# Patient Record
Sex: Male | Born: 1990 | Race: White | Hispanic: No | Marital: Single | Smoking: Never smoker
Health system: Southern US, Community
[De-identification: ages and names within clinical notes are randomized; demographics above are authoritative.]

## PROBLEM LIST (undated history)

## (undated) DIAGNOSIS — F411 Generalized anxiety disorder: Secondary | ICD-10-CM

## (undated) DIAGNOSIS — F419 Anxiety disorder, unspecified: Secondary | ICD-10-CM

## (undated) DIAGNOSIS — I10 Essential (primary) hypertension: Secondary | ICD-10-CM

## (undated) DIAGNOSIS — F319 Bipolar disorder, unspecified: Secondary | ICD-10-CM

## (undated) DIAGNOSIS — F4310 Post-traumatic stress disorder, unspecified: Secondary | ICD-10-CM

## (undated) DIAGNOSIS — F410 Panic disorder [episodic paroxysmal anxiety] without agoraphobia: Secondary | ICD-10-CM

## (undated) DIAGNOSIS — F429 Obsessive-compulsive disorder, unspecified: Secondary | ICD-10-CM

## (undated) DIAGNOSIS — J45909 Unspecified asthma, uncomplicated: Secondary | ICD-10-CM

## (undated) HISTORY — PX: NO PAST SURGERIES: SHX2092

---

## 2012-08-04 ENCOUNTER — Encounter (HOSPITAL_COMMUNITY): Payer: Self-pay

## 2012-08-04 ENCOUNTER — Other Ambulatory Visit: Payer: Self-pay

## 2012-08-04 ENCOUNTER — Emergency Department (HOSPITAL_COMMUNITY)
Admission: EM | Admit: 2012-08-04 | Discharge: 2012-08-04 | Disposition: A | Discharge status: Home / Self Care | Payer: Federal, State, Local not specified - PPO | Attending: Emergency Medicine | Admitting: Emergency Medicine

## 2012-08-04 DIAGNOSIS — F411 Generalized anxiety disorder: Secondary | ICD-10-CM | POA: Insufficient documentation

## 2012-08-04 DIAGNOSIS — R079 Chest pain, unspecified: Secondary | ICD-10-CM | POA: Insufficient documentation

## 2012-08-04 DIAGNOSIS — J45909 Unspecified asthma, uncomplicated: Secondary | ICD-10-CM | POA: Insufficient documentation

## 2012-08-04 DIAGNOSIS — Z79899 Other long term (current) drug therapy: Secondary | ICD-10-CM | POA: Insufficient documentation

## 2012-08-04 DIAGNOSIS — F419 Anxiety disorder, unspecified: Secondary | ICD-10-CM

## 2012-08-04 HISTORY — DX: Panic disorder (episodic paroxysmal anxiety): F41.0

## 2012-08-04 HISTORY — DX: Anxiety disorder, unspecified: F41.9

## 2012-08-04 HISTORY — DX: Unspecified asthma, uncomplicated: J45.909

## 2012-08-04 MED ORDER — LORAZEPAM 1 MG PO TABS
1.0000 mg | ORAL_TABLET | Freq: Once | ORAL | Status: AC
  Administered 2012-08-04: 1 mg via ORAL
  Filled 2012-08-04: qty 1

## 2012-08-04 MED ORDER — LORAZEPAM 1 MG PO TABS: 1.0000 mg | ORAL_TABLET | Freq: Three times a day (TID) | ORAL | Status: DC | PRN

## 2012-08-04 NOTE — ED Notes (Signed)
Pt c/o " feeling my chest hurts and I feel like I can't breathe" Charge nurse made aware and pt taken to Acute side of ED for evaluation

## 2012-08-04 NOTE — ED Provider Notes (Signed)
History     CSN: [id number]  Arrival date & time 08/04/12  1715   First MD Initiated Contact with Patient 08/04/12 1832      Chief Complaint  Patient presents with  . Anxiety  . Chest Pain    (Consider location/radiation/quality/duration/timing/severity/associated sxs/prior treatment) HPI Comments: Patient with hx of recurrent panic attacks and anxiety states that he developed a sudden onset of chest tightness, difficulty breathing, and "the jitters" after he was arguing with his mother this afternoon.  States that he recently moved here from [place] and his doctor there was trying different medications to control the panic attacks.  He denies cocaine use, street drugs or IV drug use.  He has not tried any medications or home therapies.  States he has not been evaluated by a doctor here for his anxiety.  Patient is a 22 y.o. male presenting with anxiety. The history is provided by the patient.  Anxiety This is a recurrent problem. The current episode started today. The problem occurs intermittently. The problem has been unchanged. Associated symptoms include chest pain. Pertinent negatives include no congestion, coughing, fever, headaches, joint swelling, nausea, neck pain, numbness, rash, vomiting or weakness. Associated symptoms comments: Chest tightness and "jitters". The symptoms are aggravated by stress. He has tried nothing for the symptoms. The treatment provided no relief.    Past Medical History  Diagnosis Date  . Asthma   . Anxiety   . Panic attack     History reviewed. No pertinent past surgical history.  No family history on file.  History  Substance Use Topics  . Smoking status: Never Smoker   . Smokeless tobacco: Not on file  . Alcohol Use: No      Review of Systems  Constitutional: Negative for fever, activity change and appetite change.  HENT: Negative for congestion and neck pain.   Respiratory: Positive for chest tightness. Negative for cough and  shortness of breath.   Cardiovascular: Positive for chest pain. Negative for palpitations and leg swelling.  Gastrointestinal: Negative for nausea and vomiting.  Musculoskeletal: Negative for joint swelling.  Skin: Negative for rash.  Neurological: Negative for seizures, syncope, speech difficulty, weakness, numbness and headaches.  Psychiatric/Behavioral: Negative for confusion. The patient is nervous/anxious.   All other systems reviewed and are negative.    Allergies  Review of patient's allergies indicates no known allergies.  Home Medications   Current Outpatient Rx  Name  Route  Sig  Dispense  Refill  . atenolol (TENORMIN) 50 MG tablet   Oral   Take 50 mg by mouth daily.           BP 135/82  Pulse 77  Temp(Src) 97.2 F (36.2 C) (Oral)  Resp 30  Ht 6' (1.829 m)  Wt 210 lb (95.255 kg)  BMI 28.47 kg/m2  SpO2 99%  Physical Exam  Nursing note and vitals reviewed. Constitutional: He is oriented to person, place, and time. He appears well-developed and well-nourished. No distress.  HENT:  Head: Normocephalic and atraumatic.  Mouth/Throat: Oropharynx is clear and moist.  Eyes: Conjunctivae are normal. Pupils are equal, round, and reactive to light.  Neck: Normal range of motion. Neck supple. No thyromegaly present.  Cardiovascular: Normal rate, regular rhythm, normal heart sounds and intact distal pulses.   No murmur heard. Pulmonary/Chest: Effort normal and breath sounds normal. No respiratory distress.  Musculoskeletal: Normal range of motion. He exhibits no edema.  Neurological: He is alert and oriented to person, place, and time. He  exhibits normal muscle tone. Coordination normal.  Skin: Skin is warm and dry.  Psychiatric: His speech is normal and behavior is normal. Thought content normal. His mood appears anxious. He does not exhibit a depressed mood.    ED Course  Procedures (including critical care time)  Labs Reviewed - No data to display No results  found.      MDM    Date: 08/04/2012  Rate: 59  Rhythm: sinus bradycardia  QRS Axis: normal  Intervals: normal  ST/T Wave abnormalities: normal  Conduction Disutrbances:none  Narrative Interpretation:   Old EKG Reviewed: none available    EKG read by Dr. Fleet Contras     Patient is feeling better after po ativan, chest pain improved, vitals signs improved.  No hx of cardiac dz, denies IVDU, or cocaine.  Hx of anxiety and been without his medications for several weeks.  Doubt PE, or cardiac process.  Pt agrees to f/u with [facility].  Referral info given.     The patient appears reasonably screened and/or stabilized for discharge and I doubt any other medical condition or other [facility] requiring further screening, evaluation, or treatment in the ED at this time prior to discharge.       Levaeh Vice L. Trisha Mangle, PA-C 08/04/12 2331

## 2012-08-04 NOTE — ED Notes (Signed)
Pt presents with a panic attack that occurred today. Patient has a hx of anxiety and panic attacks and says that it is hard for him to breathe right now. Pt in no acute distress.

## 2012-08-04 NOTE — Discharge Instructions (Signed)
Anxiety and Panic Attacks Your caregiver has informed you that you are having an anxiety or panic attack. There may be many forms of this. Most of the time these attacks come suddenly and without warning. They come at any time of day, including periods of sleep, and at any time of life. They may be strong and unexplained. Although panic attacks are very scary, they are physically harmless. Sometimes the cause of your anxiety is not known. Anxiety is a protective mechanism of the body in its fight or flight mechanism. Most of these perceived danger situations are actually nonphysical situations (such as anxiety over losing a job). CAUSES  The causes of an anxiety or panic attack are many. Panic attacks may occur in otherwise healthy people given a certain set of circumstances. There may be a genetic cause for panic attacks. Some medications may also have anxiety as a side effect. SYMPTOMS  Some of the most common feelings are:  Intense terror.  Dizziness, feeling faint.  Hot and cold flashes.  Fear of going crazy.  Feelings that nothing is real.  Sweating.  Shaking.  Chest pain or a fast heartbeat (palpitations).  Smothering, choking sensations.  Feelings of impending doom and that death is near.  Tingling of extremities, this may be from over-breathing.  Altered reality (derealization).  Being detached from yourself (depersonalization). Several symptoms can be present to make up anxiety or panic attacks. DIAGNOSIS  The evaluation by your caregiver will depend on the type of symptoms you are experiencing. The diagnosis of anxiety or panic attack is made when no physical illness can be determined to be a cause of the symptoms. TREATMENT  Treatment to prevent anxiety and panic attacks may include:  Avoidance of circumstances that cause anxiety.  Reassurance and relaxation.  Regular exercise.  Relaxation therapies, such as yoga.  Psychotherapy with a psychiatrist or  therapist.  Avoidance of caffeine, alcohol and illegal drugs.  Prescribed medication. SEEK IMMEDIATE MEDICAL CARE IF:   You experience panic attack symptoms that are different than your usual symptoms.  You have any worsening or concerning symptoms. Document Released: 05/08/2005 Document Revised: 07/31/2011 Document Reviewed: 09/09/2009 [facility] Patient Information 2013 [place], [place].  RESOURCE GUIDE  Chronic Pain Problems: Contact [facility]  [phone] Patients need to be referred by their primary care doctor.  Insufficient Money for Medicine: Contact United Way:  call "211."   No Primary Care Doctor: - Call Health Connect  [phone] - can help you locate a primary care doctor that  accepts your insurance, provides certain services, etc. - Physician Referral Service- [phone]  Agencies that provide inexpensive medical care: - [facility]  [phone] - [facility]  [phone] - [facility]  [phone] - Women's Clinic  [phone] - Planned Parenthood  [phone] [facility]  [phone]  Medicaid-accepting [facility] Providers: - [facility]- [address], [address]  [phone], Mon-Fri 9am-7pm, Sat 9am-1pm - [facility]- [address] [place], Suite [place]  [phone] - [facility]- [address] Suite [place]  [phone] [facility]- [address]  [phone] - Renaye Rakers- [address] [place], [address], [phone]  Only accepts [place] patients after they have their name  applied to their card  Self Pay (no insurance) in [place]: - Sickle Cell Patients: Dr Willey Blade, [facility] Internal [facility]  [address] [place], [phone] -  [facility]- [address] [place]  [phone]       Patrcia Dolly [facility] [place]- [address]       -     [facility]-  see information above (Speak to [employer] if you do not have insurance)       -  [facility]- [address],  [phone]       -  [facility]- [address] [phone]       -  Dr Julio Sicks-  [address], [place], [phone]       -  Urgent Medical and [facility] - [address], [phone]       -  [facility]- [address], [phone], also [address] [phone]       -    [facility]- [address] [place], [phone], 1st & 3rd Saturday        every month, 10am-1pm  [facility] [address] [place], [place] [address] [phone]  [facility] [address] [place], [place] [address] [phone]  1) Find a Doctor and Pay Out of Pocket Although you won't have to find out who is covered by your insurance plan, it is a good idea to ask around and get recommendations. You will then need to call the office and see if the doctor you have chosen will accept you as a new patient and what types of options they offer for patients who are self-pay. Some doctors offer discounts or will set up payment plans for their patients who do not have insurance, but you will need to ask so you aren't surprised when you get to your appointment.  2) Contact Your Local Health Department Not all health departments have doctors that can see patients for sick visits, but many do, so it is worth a call to see if yours does. If you don't know where your local health department is, you can check in your phone book. The CDC also has a tool to help you locate your state's health department, and many state websites also have listings of all of their local health departments.  3) Find a Walk-in Clinic If your illness is not likely to be very severe or complicated, you may want to try a walk in clinic. These are popping up all over the country in pharmacies, drugstores, and shopping centers. They're  usually staffed by nurse practitioners or physician assistants that have been trained to treat common illnesses and complaints. They're usually fairly quick and inexpensive. However, if you have serious medical issues or chronic medical problems, these are probably not your best option  STD Testing - [facility] Department of [facility], STD Clinic, [address], [place], phone [phone] or [phone].  Monday - Friday, call for an appointment. [facility] of [employer], STD Clinic, [address], [place], phone [phone] or [phone].  Monday - Friday, call for an appointment.  Abuse/Neglect: [facility] Child Abuse Hotline (440) 307-8307 [facility] Child Abuse Hotline 325-380-3063 (After Hours)  Emergency Shelter:  [facility] (949)707-2803  Maternity Homes: - Room at the [place] [facility] [phone] - [facility] [phone]  MRSA Hotline #:   815-765-1291  Dental Assistance If unable  to pay or uninsured, contact:  [facility]. to become qualified for the adult dental clinic.  Patients with Medicaid: [facility] [phone] [address], [phone] [address] [phone]  If unable to pay, or uninsured, contact [facility] [phone] in [place], [phone] in [facility]) to become qualified for the adult dental clinic  [facility] [address] [place], [place] [address] [phone] [email or web address]  Other Proofreader Services: - [facility]- [address] [place], [place], [place], [address], [phone], Ext. [phone], 2nd and 4th Thursday of the month at 6:30am.  10 clients each day by appointment, can sometimes see walk-in patients if someone does not show for an appointment. [facility]- [address] Ether Griffins [place], [place], [address], [phone] - [facility] [address], [place], [place], [address], [phone] - [facility]- [phone] [facility] Department- [phone] [facility] [phone]       Behavioral Health Resources in the [facility]  Intensive Outpatient Programs: [facility]      [address] [place], [place] [phone] Both a day and evening program       [facility] Outpatient     [address]        [place], [place] [address] [phone]         ADS: [facility] [address] [place] [phone]  [facility] ACCESS LINE: 7085994979 or [phone] [address] [place], [place] [address] [email or web address]   Substance Abuse Resources: - [facility] Drug [facility]  [phone] - [facility] [phone] - [facility] (815)[phone] Floydene Flock [phone] - Residential & Outpatient Substance Abuse Program  [phone]  Psychological Services: [facility]  [phone] [facility] Services  ([phone] - [facility], [phone] [place]. [address], [place], ACCESS LINE: 4306833374 or [phone], [email or web address]  Mobile Crisis Teams:                                        Therapeutic Alternatives         Mobile Crisis Care Unit [phone]             [facility] [address] Ginette Otto [phone]                                         Interventionist [address] [place] [address] [place] [phone]  Self-Help/Support Groups: Mental Health Assoc. of [employer] of support groups 828-577-3986 (call for more info)   Narcotics Anonymous (NA) Caring Services [address] [place] - 2 meetings at this location  Residential Treatment Programs:  [facility]  Treatment      [address]        [place]       [phone]         [facility] [address] [place] [address] [place], [place]  [address] [phone]  [facility]  [address] [place], [place] [address] [phone] Admissions: 8am-3pm M-F  [facility]     [address]  [place], [place] [address]       [phone]         [facility] [address] [place]ling Manns [place], [place] [phone]  [facility] [address] [place], [place] [phone]  [facility] - Intensive Outpatient      [address]     [place], [place]       [phone]         [facility] (Addiction Recovery Care [facility]oc.)     [address] [place], [place] [phone] or ([phone])[phone]  Residential Treatment Services (RTS), Medicaid [address] [place], [place] [phone]  Fellowship [address]                                               [address] [place] [phone]  [facility] Resources: [facility]([phone]               General Therapy                                                Angie Fava, PhD        [address] [place], [place] [address]         [phone]   Insurance  [facility] Behavioral   [address] [place], [place] [address] [phone]  [facility] [address] [place], [place] [address] [phone] Insurance/Medicaid/sponsorship through [facility] and Families                                              [address]                                        [place], [place] [address]    Therapy/tele-psych/case         [phone]          [facility] [address], [place]  [address]  Adolescent/group home/case management [phone]                                           Creola Corn PhD       General  therapy       Insurance   [phone]         Dr. Lolly Mustache, [place], M-F [phone]  [facility] [place]  United [employer] [facility] Dept. [address]                 [address]         [address] [place] [address]  [address]  Cristobal Goldmann Phone:  [phone]                                  Phone:  [phone]                   Phone:  [phone]  [facility], [phone] - [facility] - [facility]- [phone]       -     [facility] in [place], [address],             [phone], [facility] Child Abuse Hotline 502-859-4524 or [phone] (After Hours)

## 2012-08-04 NOTE — ED Notes (Signed)
No beds currently available on Acute side. Pt to be evaluated in fast track

## 2012-08-05 NOTE — ED Provider Notes (Signed)
Medical screening examination/treatment/procedure(s) were performed by non-physician practitioner and as supervising physician I was immediately available for consultation/collaboration.    Vida Roller, MD 08/05/12 [phone]

## 2012-10-29 ENCOUNTER — Emergency Department (HOSPITAL_COMMUNITY): Payer: Federal, State, Local not specified - PPO

## 2012-10-29 ENCOUNTER — Encounter (HOSPITAL_COMMUNITY): Payer: Self-pay | Admitting: *Deleted

## 2012-10-29 ENCOUNTER — Emergency Department (HOSPITAL_COMMUNITY)
Admission: EM | Admit: 2012-10-29 | Discharge: 2012-10-29 | Disposition: A | Discharge status: Home / Self Care | Payer: Federal, State, Local not specified - PPO | Attending: Emergency Medicine | Admitting: Emergency Medicine

## 2012-10-29 DIAGNOSIS — J45909 Unspecified asthma, uncomplicated: Secondary | ICD-10-CM | POA: Insufficient documentation

## 2012-10-29 DIAGNOSIS — S0003XA Contusion of scalp, initial encounter: Secondary | ICD-10-CM | POA: Insufficient documentation

## 2012-10-29 DIAGNOSIS — I10 Essential (primary) hypertension: Secondary | ICD-10-CM | POA: Insufficient documentation

## 2012-10-29 DIAGNOSIS — S0083XA Contusion of other part of head, initial encounter: Secondary | ICD-10-CM

## 2012-10-29 DIAGNOSIS — F411 Generalized anxiety disorder: Secondary | ICD-10-CM | POA: Insufficient documentation

## 2012-10-29 DIAGNOSIS — S1093XA Contusion of unspecified part of neck, initial encounter: Secondary | ICD-10-CM | POA: Insufficient documentation

## 2012-10-29 DIAGNOSIS — Z79899 Other long term (current) drug therapy: Secondary | ICD-10-CM | POA: Insufficient documentation

## 2012-10-29 DIAGNOSIS — IMO0002 Reserved for concepts with insufficient information to code with codable children: Secondary | ICD-10-CM | POA: Insufficient documentation

## 2012-10-29 HISTORY — DX: Essential (primary) hypertension: I10

## 2012-10-29 MED ORDER — HYDROCODONE-ACETAMINOPHEN 5-325 MG PO TABS: 1.0000 | ORAL_TABLET | ORAL | Status: DC | PRN

## 2012-10-29 MED ORDER — HYDROCODONE-ACETAMINOPHEN 5-325 MG PO TABS
1.0000 | ORAL_TABLET | Freq: Once | ORAL | Status: DC
  Filled 2012-10-29: qty 1

## 2012-10-29 MED ORDER — IBUPROFEN 800 MG PO TABS
800.0000 mg | ORAL_TABLET | Freq: Once | ORAL | Status: AC
  Administered 2012-10-29: 800 mg via ORAL
  Filled 2012-10-29: qty 1
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
## 2012-11-07 ENCOUNTER — Encounter (HOSPITAL_COMMUNITY): Payer: Self-pay

## 2012-11-07 ENCOUNTER — Emergency Department (HOSPITAL_COMMUNITY)
Admission: EM | Admit: 2012-11-07 | Discharge: 2012-11-07 | Disposition: A | Discharge status: Home / Self Care | Payer: Federal, State, Local not specified - PPO | Attending: Emergency Medicine | Admitting: Emergency Medicine

## 2012-11-07 DIAGNOSIS — Z79899 Other long term (current) drug therapy: Secondary | ICD-10-CM | POA: Insufficient documentation

## 2012-11-07 DIAGNOSIS — R110 Nausea: Secondary | ICD-10-CM | POA: Insufficient documentation

## 2012-11-07 DIAGNOSIS — R0602 Shortness of breath: Secondary | ICD-10-CM | POA: Insufficient documentation

## 2012-11-07 DIAGNOSIS — R42 Dizziness and giddiness: Secondary | ICD-10-CM | POA: Insufficient documentation

## 2012-11-07 DIAGNOSIS — R450 Nervousness: Secondary | ICD-10-CM | POA: Insufficient documentation

## 2012-11-07 DIAGNOSIS — F410 Panic disorder [episodic paroxysmal anxiety] without agoraphobia: Secondary | ICD-10-CM | POA: Insufficient documentation

## 2012-11-07 DIAGNOSIS — I10 Essential (primary) hypertension: Secondary | ICD-10-CM | POA: Insufficient documentation

## 2012-11-07 DIAGNOSIS — J45909 Unspecified asthma, uncomplicated: Secondary | ICD-10-CM | POA: Insufficient documentation

## 2012-11-07 DIAGNOSIS — F419 Anxiety disorder, unspecified: Secondary | ICD-10-CM

## 2012-11-07 DIAGNOSIS — R002 Palpitations: Secondary | ICD-10-CM | POA: Insufficient documentation

## 2012-11-07 MED ORDER — CLONAZEPAM 1 MG PO TABS: 1.0000 mg | ORAL_TABLET | Freq: Two times a day (BID) | ORAL | Status: DC | PRN

## 2012-11-07 MED ORDER — LORAZEPAM 1 MG PO TABS
1.0000 mg | ORAL_TABLET | Freq: Once | ORAL | Status: AC
  Administered 2012-11-07: 1 mg via ORAL
  Filled 2012-11-07: qty 1

## 2012-11-07 NOTE — ED Notes (Signed)
Tele-psych consult being performed at this time.

## 2012-11-07 NOTE — ED Notes (Signed)
Up to br with no difficulties noted.

## 2012-11-07 NOTE — ED Notes (Signed)
Pt was sitting in room listening to music and started to have "anxiety attack" per pt. Pt anxious upon arrival. Denies si/hi
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
## 2012-11-13 MED FILL — Hydrocodone-Acetaminophen Tab 5-325 MG: ORAL | Qty: 6 | Status: AC

## 2013-08-14 ENCOUNTER — Encounter (HOSPITAL_COMMUNITY): Payer: Self-pay | Admitting: Psychiatry

## 2013-08-14 ENCOUNTER — Ambulatory Visit (INDEPENDENT_AMBULATORY_CARE_PROVIDER_SITE_OTHER): Payer: Federal, State, Local not specified - PPO | Admitting: Psychiatry

## 2013-08-14 VITALS — BP 140/88 | Ht 72.0 in | Wt 218.0 lb

## 2013-08-14 DIAGNOSIS — F411 Generalized anxiety disorder: Secondary | ICD-10-CM | POA: Insufficient documentation

## 2013-08-14 MED ORDER — CLONAZEPAM 1 MG PO TABS: 1.0000 mg | ORAL_TABLET | Freq: Three times a day (TID) | ORAL | Status: DC

## 2013-08-14 NOTE — Progress Notes (Signed)
Psychiatric Assessment Adult  Patient Identification:  Richard Heath Date of Evaluation:  08/14/2013 Chief Complaint: "I'm very anxious and having panic attacks History of Chief Complaint:   Chief Complaint  Patient presents with  . Anxiety  . Establish Care    Anxiety Symptoms include decreased concentration and nervous/anxious behavior.     this patient is a 23 year old single white male who lives with his mother, mother's boyfriend. The boyfriend's mother and his uncle with cerebral palsy in [place]. 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  The patient is self-referred. He's been seen in the emergency room at [facility] a couple of times in the last year. He states that he moved down here from [place] about 6 months ago to live with his mother. He's had significant panic and anxiety problems ever since he can remember. As a child he was anxious and worried often. He worried about the weather and about his parents health. He did not have significant separation anxiety.  As he got older the panic attacks have worsened. He left high school in the 12th grade because being around the other kids was bothering him. He got a GED. In the past he was being treated by his family doctor and was tried on Zoloft and Wellbutrin without much success. Lorazepam did not help. He went to our emergency room last summer and was given a short prescription for clonazepam which he did think was helpful.  Currently he is having panic attacks twice a day. They're characterized by tachycardia sweating feeling sick and anxious. He is nervous and jittery the rest of the time and having difficulty sleeping. His thoughts race. 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 He has made some friends here and has been playing basketball. He denies being depressed or sad. He's never been suicidal or homicidal.  He denies auditory or visual sensations or paranoia. He was smoking marijuana in the past but has not done this in several months he denies the use of alcohol or any other drugs Review of Systems  Constitutional: Negative.   HENT: Negative.   Eyes: Negative.   Respiratory: Negative.   Cardiovascular: Negative.   Gastrointestinal: Negative.   Endocrine: Negative.   Genitourinary: Negative.   Musculoskeletal: Negative.   Skin: Negative.   Allergic/Immunologic: Negative.   Neurological: Negative.   Hematological: Negative.   Psychiatric/Behavioral: Positive for sleep disturbance and decreased concentration. The patient is nervous/anxious.    Physical Exam not done  Depressive Symptoms: insomnia, anxiety, panic attacks,  (Hypo) Manic Symptoms:   Elevated Mood:  No Irritable Mood:  No Grandiosity:  No Distractibility:  Yes Labiality of Mood:  No Delusions:  No Hallucinations:  No Impulsivity:  No Sexually Inappropriate Behavior:  No Financial Extravagance:  No Flight of Ideas:  No  Anxiety Symptoms: Excessive Worry:  Yes Panic Symptoms:  Yes Agoraphobia:  No Obsessive Compulsive: Yes  Symptoms: Obsessive cleaning Specific Phobias:  No Social Anxiety:  No  Psychotic Symptoms:  Hallucinations: No None Delusions:  No Paranoia:  No   Ideas of Reference:  No  PTSD Symptoms: Ever had a traumatic exposure:  No Had a traumatic exposure in the last month:  No Re-experiencing: No None Hypervigilance:  No Hyperarousal: No None Avoidance: No None  Traumatic Brain Injury: No   Past Psychiatric History: Diagnosis: Generalized anxiety disorder   Hospitalizations: none  Outpatient Care: Only through family physician   Substance Abuse Care: none  Self-Mutilation: none  Suicidal Attempts:none  Violent Behaviors: none   Past Medical History:   Past Medical History  Diagnosis Date  . Asthma   . Anxiety   . Panic attack   . Hypertension    History of Loss of  Consciousness:  No Seizure History:  No Cardiac History:  No Allergies:  No Known Allergies Current Medications:  Current Outpatient Prescriptions  Medication Sig Dispense Refill  . clonazePAM (KLONOPIN) 1 MG tablet Take 1 tablet (1 mg total) by mouth 3 (three) times daily.  90 tablet  2   No current facility-administered medications for this visit.    Previous Psychotropic Medications:  Medication Dose     Zoloft Wellbutrin and Ativan                      Substance Abuse History in the last 12 months: Substance Age of 1st Use Last Use Amount Specific Type  Nicotine      Alcohol      Cannabis      Opiates      Cocaine      Methamphetamines      LSD      Ecstasy      Benzodiazepines      Caffeine      Inhalants      Others:                          Medical Consequences of Substance Abuse: n/a  Legal Consequences of Substance Abuse:n/a  Family Consequences of Substance Abuse: n/a  Blackouts:  No DT's:  No Withdrawal Symptoms:  No None  Social History: [place]idence: [place] Place [place] Birth[place] Family Members: Mother father one older brother Marital Status:  Single Children:  None Relationships:  Education:  GED Educational Problems/Performance:  Religious Beliefs/Practices: Christian History of Abuse: none Occupational Experiences; applying to work at [employer] Military History:  None. Legal History: Tiketed once for trespassing Hobbies/Interests: Following sports, playing basketball  Family History:   Family History  Problem Relation Age of Onset  . Anxiety disorder Father   . Alcohol abuse Father   . Anxiety disorder Maternal Aunt   . Anxiety disorder Cousin     Mental Status Examination/Evaluation: Objective:  Appearance: Casual and Well Groomed  Patent attorneyye Contact::  Fair  Speech:  Pressured  Volume:  Normal  Mood:  Anxious and jumpy   Affect:  Congruent  Thought Process:  Goal Directed   Orientation:  Full (Time, Place, and Person)  Thought Content:  WDL  Suicidal Thoughts:  No  Homicidal Thoughts:  No  Judgement:  Good  Insight:  Good  Psychomotor Activity:  Restlessness  Akathisia:  No  Handed:  Right  AIMS (if indicated):    Assets:  Communication Skills Desire for Improvement Physical Health Social Support    Laboratory/X-Ray Psychological Evaluation(s)        Assessment:  Axis I: Generalized Anxiety Disorder  AXIS I Generalized Anxiety Disorder  AXIS II Deferred  AXIS III Past Medical History  Diagnosis Date  . Asthma   . Anxiety   . Panic attack   . Hypertension      AXIS IV other psychosocial or environmental problems  AXIS V 51-60 moderate symptoms   Treatment Plan/Recommendations:  Plan of Care: Medication management   Laboratory  Psychotherapy: Declines  Medications: The patient will start clonazepam 1 mg 3 times a day on a scheduled basis   Routine PRN Medications:  No  Consultations:   Safety Concerns:    Other:  She will return in four-week's     Diannia Ruder, MD 3/26/20159:40 AM

## 2013-08-16 ENCOUNTER — Emergency Department (HOSPITAL_COMMUNITY)
Admission: EM | Admit: 2013-08-16 | Discharge: 2013-08-16 | Disposition: A | Discharge status: Other Acute Inpatient Hospital | Payer: Federal, State, Local not specified - PPO | Attending: Emergency Medicine | Admitting: Emergency Medicine

## 2013-08-16 ENCOUNTER — Encounter (HOSPITAL_COMMUNITY): Payer: Self-pay | Admitting: *Deleted

## 2013-08-16 ENCOUNTER — Inpatient Hospital Stay (HOSPITAL_COMMUNITY)
Admission: AD | Admit: 2013-08-16 | Discharge: 2013-08-20 | DRG: 885 | Disposition: A | Discharge status: Home / Self Care | Payer: Federal, State, Local not specified - PPO | Source: Intra-hospital | Attending: Psychiatry | Admitting: Psychiatry

## 2013-08-16 ENCOUNTER — Encounter (HOSPITAL_COMMUNITY): Payer: Self-pay | Admitting: Emergency Medicine

## 2013-08-16 DIAGNOSIS — F3162 Bipolar disorder, current episode mixed, moderate: Secondary | ICD-10-CM

## 2013-08-16 DIAGNOSIS — F411 Generalized anxiety disorder: Secondary | ICD-10-CM | POA: Insufficient documentation

## 2013-08-16 DIAGNOSIS — F4310 Post-traumatic stress disorder, unspecified: Secondary | ICD-10-CM | POA: Diagnosis present

## 2013-08-16 DIAGNOSIS — R45851 Suicidal ideations: Secondary | ICD-10-CM | POA: Insufficient documentation

## 2013-08-16 DIAGNOSIS — Z79899 Other long term (current) drug therapy: Secondary | ICD-10-CM | POA: Insufficient documentation

## 2013-08-16 DIAGNOSIS — F429 Obsessive-compulsive disorder, unspecified: Secondary | ICD-10-CM | POA: Insufficient documentation

## 2013-08-16 DIAGNOSIS — F329 Major depressive disorder, single episode, unspecified: Principal | ICD-10-CM

## 2013-08-16 DIAGNOSIS — F32A Depression, unspecified: Secondary | ICD-10-CM

## 2013-08-16 DIAGNOSIS — F3289 Other specified depressive episodes: Secondary | ICD-10-CM | POA: Insufficient documentation

## 2013-08-16 DIAGNOSIS — I10 Essential (primary) hypertension: Secondary | ICD-10-CM | POA: Diagnosis present

## 2013-08-16 DIAGNOSIS — F1210 Cannabis abuse, uncomplicated: Secondary | ICD-10-CM | POA: Diagnosis present

## 2013-08-16 DIAGNOSIS — J45909 Unspecified asthma, uncomplicated: Secondary | ICD-10-CM | POA: Diagnosis present

## 2013-08-16 HISTORY — DX: Generalized anxiety disorder: F41.1

## 2013-08-16 HISTORY — DX: Obsessive-compulsive disorder, unspecified: F42.9

## 2013-08-16 LAB — CBC WITH DIFFERENTIAL/PLATELET
BASOS ABS: 0 10*3/uL (ref 0.0–0.1)
Basophils Relative: 0 % (ref 0–1)
EOS ABS: 0.1 10*3/uL (ref 0.0–0.7)
EOS PCT: 1 % (ref 0–5)
HCT: 42.3 % (ref 39.0–52.0)
Hemoglobin: 14.3 g/dL (ref 13.0–17.0)
LYMPHS ABS: 2.7 10*3/uL (ref 0.7–4.0)
Lymphocytes Relative: 25 % (ref 12–46)
MCH: 29.5 pg (ref 26.0–34.0)
MCHC: 33.8 g/dL (ref 30.0–36.0)
MCV: 87.4 fL (ref 78.0–100.0)
Monocytes Absolute: 0.9 10*3/uL (ref 0.1–1.0)
Monocytes Relative: 8 % (ref 3–12)
Neutro Abs: 7.1 10*3/uL (ref 1.7–7.7)
Neutrophils Relative %: 66 % (ref 43–77)
PLATELETS: 266 10*3/uL (ref 150–400)
RBC: 4.84 MIL/uL (ref 4.22–5.81)
RDW: 12.9 % (ref 11.5–15.5)
WBC: 10.8 10*3/uL — ABNORMAL HIGH (ref 4.0–10.5)

## 2013-08-16 LAB — URINALYSIS, ROUTINE W REFLEX MICROSCOPIC
BILIRUBIN URINE: NEGATIVE
GLUCOSE, UA: NEGATIVE mg/dL
Hgb urine dipstick: NEGATIVE
KETONES UR: NEGATIVE mg/dL
LEUKOCYTES UA: NEGATIVE
Nitrite: NEGATIVE
PROTEIN: NEGATIVE mg/dL
Specific Gravity, Urine: 1.02 (ref 1.005–1.030)
Urobilinogen, UA: 0.2 mg/dL (ref 0.0–1.0)
pH: 7 (ref 5.0–8.0)

## 2013-08-16 LAB — BASIC METABOLIC PANEL
BUN: 20 mg/dL (ref 6–23)
CALCIUM: 10 mg/dL (ref 8.4–10.5)
CO2: 31 mEq/L (ref 19–32)
CREATININE: 0.83 mg/dL (ref 0.50–1.35)
Chloride: 99 mEq/L (ref 96–112)
GFR calc Af Amer: >90 mL/min (ref >90)
GLUCOSE: 85 mg/dL (ref 70–99)
Potassium: 4 mEq/L (ref 3.7–5.3)
SODIUM: 140 meq/L (ref 137–147)

## 2013-08-16 LAB — ETHANOL: Alcohol, Ethyl (B): <11 mg/dL (ref 0–11)

## 2013-08-16 LAB — RAPID URINE DRUG SCREEN, HOSP PERFORMED
AMPHETAMINES: NOT DETECTED
BARBITURATES: NOT DETECTED
Benzodiazepines: NOT DETECTED
Cocaine: NOT DETECTED
Opiates: NOT DETECTED
TETRAHYDROCANNABINOL: POSITIVE — AB

## 2013-08-16 MED ORDER — LORAZEPAM 1 MG PO TABS
1.0000 mg | ORAL_TABLET | Freq: Three times a day (TID) | ORAL | Status: DC | PRN
  Administered 2013-08-16: 1 mg via ORAL
  Filled 2013-08-16: qty 1

## 2013-08-16 MED ORDER — ALUM & MAG HYDROXIDE-SIMETH 200-200-20 MG/5ML PO SUSP: 30.0000 mL | ORAL | Status: DC | PRN

## 2013-08-16 MED ORDER — MAGNESIUM HYDROXIDE 400 MG/5ML PO SUSP: 30.0000 mL | Freq: Every day | ORAL | Status: DC | PRN

## 2013-08-16 MED ORDER — ACETAMINOPHEN 325 MG PO TABS: 650.0000 mg | ORAL_TABLET | ORAL | Status: DC | PRN

## 2013-08-16 MED ORDER — ONDANSETRON HCL 4 MG PO TABS
4.0000 mg | ORAL_TABLET | Freq: Three times a day (TID) | ORAL | Status: DC | PRN
Start: 2013-08-16 — End: 2013-08-16

## 2013-08-16 MED ORDER — ACETAMINOPHEN 325 MG PO TABS
650.0000 mg | ORAL_TABLET | Freq: Four times a day (QID) | ORAL | Status: DC | PRN
Start: 2013-08-16 — End: 2013-08-20

## 2013-08-16 MED ORDER — CLONAZEPAM 1 MG PO TABS
1.0000 mg | ORAL_TABLET | Freq: Three times a day (TID) | ORAL | Status: DC
  Administered 2013-08-16 – 2013-08-19 (×10): 1 mg via ORAL
  Filled 2013-08-16 (×10): qty 1

## 2013-08-16 MED ORDER — TRAZODONE HCL 50 MG PO TABS
50.0000 mg | ORAL_TABLET | Freq: Every evening | ORAL | Status: DC | PRN
  Administered 2013-08-17 – 2013-08-19 (×4): 50 mg via ORAL
  Filled 2013-08-16 (×3): qty 1

## 2013-08-16 MED ORDER — ACETAMINOPHEN 325 MG PO TABS
650.0000 mg | ORAL_TABLET | ORAL | Status: DC | PRN
Start: 2013-08-16 — End: 2013-08-16

## 2013-08-16 MED ORDER — CLONAZEPAM 1 MG PO TABS: 1.0000 mg | ORAL_TABLET | Freq: Two times a day (BID) | ORAL | Status: DC
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
## 2013-08-16 NOTE — ED Notes (Signed)
Patient was sleeping. Vitals taken, let patient know that breakfast will be up shortly.

## 2013-08-16 NOTE — Progress Notes (Signed)
Patient ID: Richard Heath, male   DOB: [birth year], 23 y.o.   MRN: [id number] 08-16-13 nursing admission note: [facility] with a admitting dx of GAD and depressive d/o. He stated he is depressed due to the death of his cousin. He was having suicide ideation, with no plan, but denied it on admission. He also denied any hi/av on admission His affect if flat and he looks preoccupied.  He denied any pain on admission, wears contact lenses, has no allergies, does not smoke and denies any etoh or "hard drug use". He did state he smokes mj and his MD at home said it was" ok". He has a medical hx of asthma and htn. He was escorted to the 500 hall and shown the unit.

## 2013-08-16 NOTE — Progress Notes (Signed)
Pt asleep in bed, no unlabored, even breathing. No signs of distress noted. q 15 min safety checks. Pt remains safe on unit.

## 2013-08-16 NOTE — BH Assessment (Addendum)
[facility] Assessment Progress Note  [facility] is at capacity.  As of 9:51 am, pt is under review at the following hospitals that state that they have beds and are taking referrals: [facility]  The following hospitals have no appropriate beds: [facility]

## 2013-08-16 NOTE — ED Notes (Signed)
Pt awake, alert, lunch tray given.  C/o worsening anxiety.  meds given.  Delay explained.  nad noted.   Sitter at bedside.

## 2013-08-16 NOTE — BH Assessment (Signed)
Assessment complete. Per Martie LeeSabrina, [facility] is at [facility] but there are two scheduled discharges for later today. Consulted with Alberteen SamFran Hobson, NP who accepted Pt to the service of Dr. Mervyn GayJ. Jonnalagadda pending a 500-hall bed becoming available. Notified Dr. Paula LibraJohn Molpus and Tenna DelaineLori Hutchens, RN of disposition.  Harlin RainFord Ellis Ria CommentWarrick Jr, LPC, [facility] Triage Specialist
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
## 2013-08-16 NOTE — BH Assessment (Signed)
[facility] Assessment Progress Note  [facility] to Dr Elsie SaasJonnalagadda, bed [id number].  Notified Lanora ManisElizabeth, RN and Dr. Juleen ChinaKohut at [facility], who agreed with disposition. Pt will be transported by [facility].  Elizabeth stated she would fax voluntary paperwork and call report.

## 2013-08-16 NOTE — Progress Notes (Signed)
Id not attend group

## 2013-08-16 NOTE — ED Notes (Signed)
[facility] have 2 [facility] today and that will open up a bed for the patient.

## 2013-08-16 NOTE — ED Provider Notes (Signed)
CSN: [id number]     Arrival date & time 08/16/13  0120 History   First MD Initiated Contact with Patient 08/16/13 0230     Chief Complaint  Patient presents with  . Suicidal Ideation     (Consider location/radiation/quality/duration/timing/severity/associated sxs/prior Treatment) HPI This is a 23 year old male with a history of anxiety. He is here with depression but was triggered by the suicide of a cousin about 2 months ago. He has had difficulty functioning since that event. His depression has worsened worsened after an argument with his parents about 3 days ago. His depression is now severe enough that he is having thoughts of suicide. He wishes that he were dead but does not have a specific plan. He has had difficulty sleeping. He states his appetite is normal. His only physical complaint right now is a headache. He denies thoughts of hurting others. He denies drug or alcohol abuse.  Past Medical History  Diagnosis Date  . Asthma   . Anxiety   . Panic attack   . Hypertension   . GAD (generalized anxiety disorder)   . OCD (obsessive compulsive disorder)    History reviewed. No pertinent past surgical history. Family History  Problem Relation Age of Onset  . Anxiety disorder Father   . Alcohol abuse Father   . Anxiety disorder Maternal Aunt   . Anxiety disorder Cousin    History  Substance Use Topics  . Smoking status: Never Smoker   . Smokeless tobacco: Not on file  . Alcohol Use: No    Review of Systems  All other systems reviewed and are negative.   Allergies  Review of patient's allergies indicates no known allergies.  Home Medications   Current Outpatient Rx  Name  Route  Sig  Dispense  Refill  . clonazePAM (KLONOPIN) 1 MG tablet   Oral   Take 1 tablet (1 mg total) by mouth 3 (three) times daily.   90 tablet   2    There were no vitals taken for this visit.  Physical Exam General: Well-developed, well-nourished male in no acute distress; appearance  consistent with age of record HENT: normocephalic; atraumatic Eyes: pupils equal, round and reactive to light; extraocular muscles intact Neck: supple Heart: regular rate and rhythm Lungs: clear to auscultation bilaterally Abdomen: soft; nondistended; nontender; no masses or hepatosplenomegaly; bowel sounds present Extremities: No deformity; full range of motion; pulses normal Neurologic: Awake, alert and oriented; motor function intact in all extremities and symmetric; no facial droop Skin: Warm and dry Psychiatric: Depressed mood with congruent affect; SI; no HI    ED Course  Procedures (including critical care time)   MDM   Nursing notes and vitals signs, including pulse oximetry, reviewed.  Summary of this visit's results, reviewed by myself:  Labs:  Results for orders placed during the hospital encounter of 08/16/13 (from the past 24 hour(s))  CBC WITH DIFFERENTIAL     Status: Abnormal   Collection Time    08/16/13  2:42 AM      Result Value Ref Range   WBC 10.8 (*) 4.0 - 10.5 K/uL   RBC 4.84  4.22 - 5.81 MIL/uL   Hemoglobin 14.3  13.0 - 17.0 g/dL   HCT 24.242.3  68.339.0 - 41.952.0 %   MCV 87.4  78.0 - 100.0 fL   MCH 29.5  26.0 - 34.0 pg   MCHC 33.8  30.0 - 36.0 g/dL   RDW 62.212.9  29.711.5 - 98.915.5 %   Platelets 266  150 - 400 K/uL   Neutrophils Relative % 66  43 - 77 %   Neutro Abs 7.1  1.7 - 7.7 K/uL   Lymphocytes Relative 25  12 - 46 %   Lymphs Abs 2.7  0.7 - 4.0 K/uL   Monocytes Relative 8  3 - 12 %   Monocytes Absolute 0.9  0.1 - 1.0 K/uL   Eosinophils Relative 1  0 - 5 %   Eosinophils Absolute 0.1  0.0 - 0.7 K/uL   Basophils Relative 0  0 - 1 %   Basophils Absolute 0.0  0.0 - 0.1 K/uL  BASIC METABOLIC PANEL     Status: None   Collection Time    08/16/13  2:42 AM      Result Value Ref Range   Sodium 140  137 - 147 mEq/L   Potassium 4.0  3.7 - 5.3 mEq/L   Chloride 99  96 - 112 mEq/L   CO2 31  19 - 32 mEq/L   Glucose, Bld 85  70 - 99 mg/dL   BUN 20  6 - 23 mg/dL    Creatinine, Ser 1.61  0.50 - 1.35 mg/dL   Calcium 09.6  8.4 - 04.5 mg/dL   GFR calc non Af Amer >90  >90 mL/min   GFR calc Af Amer >90  >90 mL/min  ETHANOL     Status: None   Collection Time    08/16/13  2:42 AM      Result Value Ref Range   Alcohol, Ethyl (B) <11  0 - 11 mg/dL  URINALYSIS, ROUTINE W REFLEX MICROSCOPIC     Status: Abnormal   Collection Time    08/16/13  3:28 AM      Result Value Ref Range   Color, Urine YELLOW  YELLOW   APPearance HAZY (*) CLEAR   Specific Gravity, Urine 1.020  1.005 - 1.030   pH 7.0  5.0 - 8.0   Glucose, UA NEGATIVE  NEGATIVE mg/dL   Hgb urine dipstick NEGATIVE  NEGATIVE   Bilirubin Urine NEGATIVE  NEGATIVE   Ketones, ur NEGATIVE  NEGATIVE mg/dL   Protein, ur NEGATIVE  NEGATIVE mg/dL   Urobilinogen, UA 0.2  0.0 - 1.0 mg/dL   Nitrite NEGATIVE  NEGATIVE   Leukocytes, UA NEGATIVE  NEGATIVE  URINE RAPID DRUG SCREEN (HOSP PERFORMED)     Status: Abnormal   Collection Time    08/16/13  3:28 AM      Result Value Ref Range   Opiates NONE DETECTED  NONE DETECTED   Cocaine NONE DETECTED  NONE DETECTED   Benzodiazepines NONE DETECTED  NONE DETECTED   Amphetamines NONE DETECTED  NONE DETECTED   Tetrahydrocannabinol POSITIVE (*) NONE DETECTED   Barbiturates NONE DETECTED  NONE DETECTED   6:51 AM Patient accepted at [facility]. Awaiting a bed.     Hanley Seamen, MD 08/16/13 [phone]

## 2013-08-16 NOTE — Tx Team (Signed)
Initial Interdisciplinary Treatment Plan  PATIENT STRENGTHS: (choose at least two) Average or above average intelligence Communication skills Religious Affiliation Supportive family/friends  PATIENT STRESSORS: Loss of cousin   PROBLEM LIST: Problem List/Patient Goals Date to be addressed Date deferred Reason deferred Estimated date of resolution  depression 08-16-13           anxiety 08-16-13           Loss of cousin 08-16-13           Suicide ideation 08-16-13                  DISCHARGE CRITERIA:  Improved stabilization in mood, thinking, and/or behavior Reduction of life-threatening or endangering symptoms to within safe limits Verbal commitment to aftercare and medication compliance  PRELIMINARY DISCHARGE PLAN: Attend aftercare/continuing care group Return to previous living arrangement  PATIENT/FAMIILY INVOLVEMENT: This treatment plan has been presented to and reviewed with the patient, Adine MaduraJames Anspaugh, and/or family member,  The patient and family have been given the opportunity to ask questions and make suggestions.  Valente DavidWeaver, Trygg Mantz Brooks 08/16/2013, 6:23 PM

## 2013-08-16 NOTE — BH Assessment (Signed)
Received call for assessment. Spoke with Dr. Paula LibraJohn Heath who said Pt has a history of anxiety and OCD. His cousin recently committed suicide and he had an argument with his parents three days ago and is now feeling suicidal. Assessment will be initiated.  Harlin RainFord Ellis Ria CommentWarrick Jr, LPC, [facility] Triage Specialist

## 2013-08-16 NOTE — ED Notes (Signed)
[facility] Dr. Darryll CapersJonalagda to bed 507-2.  Pt and edp notified.

## 2013-08-17 DIAGNOSIS — R45851 Suicidal ideations: Secondary | ICD-10-CM

## 2013-08-17 MED ORDER — LAMOTRIGINE 25 MG PO TABS
ORAL_TABLET | ORAL | Status: AC
  Filled 2013-08-17: qty 1

## 2013-08-17 MED ORDER — LAMOTRIGINE 25 MG PO TABS
25.0000 mg | ORAL_TABLET | Freq: Two times a day (BID) | ORAL | Status: DC
  Administered 2013-08-17 – 2013-08-18 (×3): 25 mg via ORAL
  Filled 2013-08-17 (×3): qty 1

## 2013-08-17 MED ORDER — LAMOTRIGINE 25 MG PO TABS
25.0000 mg | ORAL_TABLET | Freq: Once | ORAL | Status: AC
  Administered 2013-08-17 (×2): 25 mg via ORAL
  Filled 2013-08-17: qty 1

## 2013-08-17 NOTE — Progress Notes (Signed)
Psychoeducational Group Note  Date:  08/17/2013 Time:  1015  Group Topic/Focus:  Making Healthy Choices:   The focus of this group is to help patients identify negative/unhealthy choices they were using prior to admission and identify positive/healthier coping strategies to replace them upon discharge.  Participation Level:  Active  Participation Quality:  Appropriate  Affect:  Appropriate  Cognitive:  Appropriate  Insight:  Improving  Engagement in Group:  Engaged  Additional Comments:    Vernon Maish A 08/17/2013  

## 2013-08-17 NOTE — Progress Notes (Signed)
Adult Psychoeducational Group Note  Date:  08/17/2013 Time:  9:51 PM  Group Topic/Focus:  Wrap-Up Group:   The focus of this group is to help patients review their daily goal of treatment and discuss progress on daily workbooks.  Participation Level:  Active  Participation Quality:  Appropriate  Affect:  Appropriate  Cognitive:  Appropriate  Insight: Appropriate  Engagement in Group:  Engaged  Modes of Intervention:  Discussion  Additional Comments:  The patient expressed that he had a great day because of friends.The patient said that he had an encouraging day.  Octavio Mannshigpen, Joniqua Sidle Lee 08/17/2013, 9:51 PM

## 2013-08-17 NOTE — [facility] Group Notes (Signed)
[facility] LCSW Group Therapy  08/17/2013   1:15 PM   Type of Therapy:  Group Therapy  Participation Level:  Active  Participation Quality:  Oriented and Attentive  Affect:  Flat and Depressed  Cognitive:  Alert and Appropriate  Insight:  Developing/Improving and Engaged  Engagement in Therapy:  Developing/Improving and Engaged  Modes of Intervention:  Clarification, Confrontation, Discussion, Education, Exploration, Limit-setting, Orientation, Problem-solving, Rapport Building, Dance movement psychotherapisteality Testing, Socialization and Support  Summary of Progress/Problems: Today's group topic was avoiding self sabotage and enabling behaviors. Group members were asked to define self sabotage and enabling and provide examples. Group members were then asked to discuss unhealthy relationships and how to have positive healthy boundaries with those that enable. Group members were asked to process how communicating needs and establishing a plan to change the above identified behavior.  Pt shared that he self sabotages by having negative talk and imagining situations to be worse than they are.  Pt states that he plans to simplify things in his life and take situation for what they are, not for what he thinks it could be.  Pt actively participated and was engaged in group discussion.    Reyes IvanChelsea Horton, LCSW 08/17/2013 2:56 PM

## 2013-08-17 NOTE — Progress Notes (Signed)
Psychoeducational Group Note  Date: 08/17/2013 Time:  0930  Group Topic/Focus:  Gratefulness:  The focus of this group is to help patients identify what two things they are most grateful for in their lives. What helps ground them and to center them on their work to their recovery.  Participation Level:  Active  Participation Quality:  Appropriate  Affect:  Flat  Cognitive:  Oriented  Insight:  Improving  Engagement in Group:  Engaged  Additional Comments:    Richard Heath   
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
## 2013-08-17 NOTE — Progress Notes (Signed)
D) Pt has attended the groups and rates his depression and hopelessness both at a 1. Denies SI and HI.  A) Given support, reassurance and praised for coming to group and participating. Encouraged to continue to work on his issues and to do some journaling if possible today. Pt states that he has learned it is important to be positive. R) Denies SI and HI.

## 2013-08-17 NOTE — [facility] Suicide Risk Assessment (Signed)
Suicide Risk Assessment  Admission Assessment     Nursing information obtained from:  Patient Demographic factors:  Male;Adolescent or young adult;Caucasian;Unemployed Current Mental Status:  NA (denies si/hi/av presently) Loss Factors:  Loss of significant relationship;Financial problems / change in socioeconomic status Historical Factors:  Family history of mental illness or substance abuse Risk Reduction Factors:  Sense of responsibility to family;Religious beliefs about death;Living with another person, especially a relative;Positive social support Total Time spent with patient: 45 minutes  CLINICAL FACTORS:   Panic Attacks Bipolar Disorder:   Mixed State Depression:   Anhedonia Hopelessness Impulsivity Insomnia Severe  Psychiatric Specialty Exam:     Blood pressure 133/85, pulse 92, temperature 98.1 F (36.7 C), temperature source Oral, resp. rate 20, height 6' (1.829 m), weight 216 lb (97.977 kg).Body mass index is 29.29 kg/(m^2).  General Appearance: Casual  Eye Contact::  Fair  Speech:  Slow  Volume:  Normal  Mood:  Anxious, Depressed and Dysphoric  Affect:  Constricted and Depressed  Thought Process:  Logical  Orientation:  Full (Time, Place, and Person)  Thought Content:  Rumination  Suicidal Thoughts:  Yes.  without intent/plan  Homicidal Thoughts:  No  Memory:  Immediate;   Fair Recent;   Fair Remote;   Fair  Judgement:  Intact  Insight:  Fair  Psychomotor Activity:  Decreased  Concentration:  Fair  Recall:  FiservFair  Fund of Knowledge:Fair  Language: Fair  Akathisia:  No  Handed:  Right  AIMS (if indicated):     Assets:  Desire for Improvement Housing Physical Health Social Support  Sleep:  Number of Hours: 3.5   Musculoskeletal: Strength & Muscle Tone: within normal limits Gait & Station: normal Patient leans: N/A  COGNITIVE FEATURES THAT CONTRIBUTE TO RISK:  Loss of executive function Polarized thinking Thought constriction (tunnel vision)     SUICIDE RISK:   Moderate:  Frequent suicidal ideation with limited intensity, and duration, some specificity in terms of plans, no associated intent, good self-control, limited dysphoria/symptomatology, some risk factors present, and identifiable protective factors, including available and accessible social support.  PLAN OF CARE:  I certify that inpatient services furnished can reasonably be expected to improve the patient's condition.  ARFEEN,SYED T. 08/17/2013, 2:38 PM

## 2013-08-17 NOTE — [facility] Counselor (Signed)
Adult Comprehensive Assessment  Patient ID: Richard Heath, male   DOB: [birth year], 23 y.o.   MRN: [id number]  Information Source: Information source: Patient  Current Stressors:  Educational / Learning stressors: N/A Employment / Job issues: Unemployed but plans to join [employer] soon Family Relationships: argument with mother triggered Radio broadcast assistant Financial / Lack of resources (include bankruptcy): no income, depdent on family right now Housing / Lack of housing: N/A Physical health (include injuries & life threatening diseases): N/A Social relationships: N/A Substance abuse: Occasional marijuana use Bereavement / Loss: cousin passed away 3 months ago - was his best friend  Living/Environment/Situation:  Living Arrangements: Parent;Other relatives;Non-relatives/Friends Living conditions (as described by patient or guardian): Pt lives with mother, mother's boyfriend, mother's boyfriend's mother and uncle [place].  Pt reports this is a good environment for the most part.  How long has patient lived in current situation?: 1 month What is atmosphere in current home: Supportive;Loving;Comfortable  Family History:  Marital status: Single Does patient have children?: No  Childhood History:  By whom was/is the patient raised?: Both parents Additional childhood history information: Pt reports having a great childhood.  Pt reports having anxiety as a child which caused him to drop out of school.  Description of patient's relationship with caregiver when they were a child: Pt reports getting along well with parents growing up.  Patient's description of current relationship with people who raised him/her: Pt reports getting along well with parents today.   Does patient have siblings?: Yes Number of Siblings: 1 Description of patient's current relationship with siblings: Pt reports being close to brother today Did patient suffer any verbal/emotional/physical/sexual abuse as a child?: No Did  patient suffer from severe childhood neglect?: No Has patient ever been sexually abused/assaulted/raped as an adolescent or adult?: No Was the patient ever a victim of a crime or a disaster?: No Witnessed domestic violence?: No Has patient been effected by domestic violence as an adult?: No  Education:  Highest grade of school patient has completed: GED Currently a student?: No Name of school: N/A Learning disability?: No  Employment/Work Situation:   Employment situation: Unemployed Patient's job has been impacted by current illness: No What is the longest time patient has a held a job?: 2 years Where was the patient employed at that time?: [employer] Has patient ever been in the Eli Lilly and Companymilitary?: No Has patient ever served in Buyer, retailcombat?: No  Financial Resources:   Surveyor, quantityinancial resources: Support from parents / caregiver;Private insurance Does patient have a Lawyerrepresentative payee or guardian?: No  Alcohol/Substance Abuse:   What has been your use of drugs/alcohol within the last 12 months?: Occasional marijuana use If attempted suicide, did drugs/alcohol play a role in this?: No Alcohol/Substance Abuse Treatment Hx: Denies past history If yes, describe treatment: N/A Has alcohol/substance abuse ever caused legal problems?: No  Social Support System:   Patient's Community Support System: Good Describe Community Support System: Pt reports family and church are supportive Type of faith/religion: Ephriam KnucklesChristian How does patient's faith help to cope with current illness?: prayer, church attendance  Leisure/Recreation:   Leisure and Hobbies: sports  Strengths/Needs:   What things does the patient do well?: 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   In what areas does patient struggle / problems for patient: depression, SI  Discharge Plan:   Does patient have access to transportation?: Yes Will patient be returning to same living situation  after discharge?: Yes Currently  receiving community mental health services: Yes (From Whom) ([facility]) If no, would patient like referral for services when discharged?: Yes (What county?) [facility]) Does patient have financial barriers related to discharge medications?: No  Summary/Recommendations:     Patient is a 23 year old Caucasian Male with a diagnosis of Generalized Anxiety Disorder and Unspecified Depressive Disorder.  Patient lives in [place] with family.  Pt reports getting into an argument with his mother which triggered the SI.  Pt states that he continues to grieve the loss of his best friend/cousin who passed away 3 months ago.  Patient will benefit from crisis stabilization, medication evaluation, group therapy and psycho education in addition to case management for discharge planning.    Horton, Salome Arnt. 08/17/2013

## 2013-08-18 MED ORDER — LAMOTRIGINE 25 MG PO TABS
50.0000 mg | ORAL_TABLET | Freq: Two times a day (BID) | ORAL | Status: DC
  Administered 2013-08-18 – 2013-08-20 (×4): 50 mg via ORAL
  Filled 2013-08-18 (×6): qty 2

## 2013-08-18 NOTE — [facility] Group Notes (Signed)
[facility] LCSW Group Therapy          Overcoming Obstacles       1:15 -2:30        08/18/2013       Type of Therapy:  Group Therapy  Participation Level:  Appropriate  Participation Quality:  Appropriate  Affect:  Appropriate, Alert  Cognitive:  Attentive Appropriate  Insight: Developing/Improving Engaged  Engagement in Therapy: Developing/Imprvoing Engaged  Modes of Intervention:  Discussion Exploration  Education Rapport BuildingProblem-Solving Support  Summary of Progress/Problems:  The main focus of today's group was overcoming obstacles.  He shared the obstacle he has to overcome is learning how to help out with a beloved family member who has Cerebral Palsy but also be able to take care of his needs.  He shared he is trying to get back in school and also work.  Patient was able to identify appropriate coping skills.   Wynn BankerHodnett, Heidie Krall Hairston 08/18/2013

## 2013-08-18 NOTE — [facility] Group Notes (Signed)
[facility] L[facility] Aftercare Discharge Planning Group Note   08/18/2013 12:18 PM  Participation Quality:  Patient was meeting with MD during group.  [name], Joesph JulyQuylle Hairston

## 2013-08-18 NOTE — Progress Notes (Signed)
Pt asleep, even, unlabored breathing. No signs of distress noted. q 15 min safety checks. Pt remains safe on unit 

## 2013-08-18 NOTE — Progress Notes (Signed)
D:  Patient's self inventory sheet, patient sleeps well, good appetite, normal energy level, good attention span.  Rated depression and hopeless 1, anxiety 5.  Denied withdrawals.  Denied SI.  "Consoleing, eat health, exercise" after discharge.  "You guys are great."   Plans to return home after discharge.  No problems taking meds after discharge. A:  Medications administered per MD orders.  Emotional support and encouragement given patient. R:  Denied SI and HI.  Denied A/V hallucinations.  Will continue to monitor patient for safety with 15 minute checks.  Safety maintained.

## 2013-08-18 NOTE — Tx Team (Signed)
Interdisciplinary Treatment Plan Update   Date Reviewed:  08/18/2013  Time Reviewed:  9:50 AM  Progress in Treatment:   Attending groups: Yes Participating in groups: Yes Taking medication as prescribed: Yes  Tolerating medication: Yes Family/Significant other contact made:  No, but will ask patient for consent for collateral contact Patient understands diagnosis: Yes  Discussing patient identified problems/goals with staff: Yes Medical problems stabilized or resolved: Yes Denies suicidal/homicidal ideation: Yes Patient has not harmed self or others: Yes  For review of initial/current patient goals, please see plan of care.  Estimated Length of Stay:  1-2 days  Reasons for Continued Hospitalization:  Anxiety Depression Medication stabilization   New Problems/Goals identified:    Discharge Plan or Barriers:   Home with outpatient follow up to be determined  Additional Comments:  Attendees:  Patient:  08/18/2013 9:50 AM   Signature: Mervyn GayJ. Jonnalagadda, MD 08/18/2013 9:50 AM  Signature:   08/18/2013 9:50 AM  Signature:  Verne SpurrNeil Mashburn, PA  08/18/2013 9:50 AM  Signature:Beverly Terrilee CroakKnight, RN 08/18/2013 9:50 AM  Signature:  Neill Loftarol Davis RN 08/18/2013 9:50 AM  Signature:  Juline PatchQuylle Danny Zimny, LCSW 08/18/2013 9:50 AM  Signature:  Reyes Ivanhelsea Horton, LCSW 08/18/2013 9:50 AM  Signature:  Leisa LenzValerie Enoch, Care Coordinator [facility] 08/18/2013 9:50 AM  Signature:  Aloha GellKrista Dopson, RN 08/18/2013 9:50 AM  Signature:  08/18/2013  9:50 AM  Signature:   Onnie BoerJennifer Clark, RN [facility] 08/18/2013  9:50 AM  Signature:   08/18/2013  9:50 AM    Scribe for Treatment Team:   Juline PatchQuylle Eino Whitner,  08/18/2013 9:50 AM

## 2013-08-18 NOTE — Progress Notes (Signed)
[facility] MD Progress Note  08/18/2013 12:00 PM Richard Heath  MRN:  [id number] Subjective:  Met  With patient and MD today tod discuss his plan of care and goals for treatment. Patient states he has always had anxiety. He was treated for depression as a youth with Sertraline which he was on for over a year. His symptoms are currently of anxiety which have worsened since the death of his cousin 4  Months ago from a heroin overdose. The patient and his cousin were very close.  He has since moved to [place] from [place] to be with his mother and her boyfriend, (to have fresh start in his life), the boyfriend's mother, and his mother's brother who has Cerebral Palsy. His mother takes care of her brother. He continues to report severe anxiety and daily panic attacks and is not able to work at this time. He is hoping to d/c home to [place] after in patient hospitalization, and has plans to follow up with Dr. Harrington Challenger there. Patient is tearful and anxious today during this evaluation. He is restless during the evaluation and bounces his foot through out. He states he no longer has SI/HI and is ready to leave [facility] to pursue Job [employer] options in [place].  He is taking lamictal and klonopin with limited benefits at this time..  Diagnosis:   DSM5: DSM5:  Obsessive-Compulsive Disorders: Organizing, cleaning  Trauma-Stressor Disorders: Posttraumatic Stress Disorder (309.81)  Substance/Addictive Disorders: Cannabis Use Disorder - Mild (305.20)  Depressive Disorders: Major Depressive Disorder - Severe (296.23)  AXIS I: Adjustment Disorder with Mixed Disturbance of Emotions and Conduct, Bipolar, mixed, Generalized Anxiety Disorder, Post Traumatic Stress Disorder and Substance Abuse  AXIS II: Deferred  AXIS III:  Past Medical History   Diagnosis  Date   .  Asthma    .  Anxiety    .  Panic attack    .  Hypertension    .  GAD (generalized anxiety disorder)    .  OCD (obsessive compulsive disorder)     AXIS IV: economic  problems, occupational problems, other psychosocial or environmental problems and problems related to social environment  AXIS V: 41-50 serious symptoms  Total Time spent with patient: 45 minutes ADL's:  Intact  Sleep: Good  Appetite:  Good  Suicidal Ideation:  denies Homicidal Ideation:  denies AEB (as evidenced by):  Psychiatric Specialty Exam: Physical Exam  ROS  Blood pressure 139/85, pulse 88, temperature 98.1 F (36.7 C), temperature source Oral, resp. rate 18, height 6' (1.829 m), weight 97.977 kg (216 lb).Body mass index is 29.29 kg/(m^2).  General Appearance: Casual  Eye Contact::  Fair  Speech:  Clear and Coherent  Volume:  Normal  Mood:  Anxious and Depressed  Affect:  Tearful  Thought Process:  Goal Directed  Orientation:  Full (Time, Place, and Person)  Thought Content:  WDL  Suicidal Thoughts:  No  Homicidal Thoughts:  No  Memory:  NA  Judgement:  Impaired  Insight:  Shallow  Psychomotor Activity:  Increased and Restlessness  Concentration:  Fair  Recall:  [place] of Knowledge:Fair  Language: Good  Akathisia:  No  Handed:  Right  AIMS (if indicated):     Assets:  Communication Skills Desire for Improvement Housing Physical Health  Sleep:  Number of Hours: 6.75   Musculoskeletal: Strength & Muscle Tone: within normal limits Gait & Station: normal Patient leans: N/A  Current Medications: Current Facility-Administered Medications  Medication Dose Route Frequency Provider Last Rate Last Dose  .  acetaminophen (TYLENOL) tablet 650 mg  650 mg Oral Q6H PRN Lurena Nida, NP      . alum & mag hydroxide-simeth (MAALOX/MYLANTA) 200-200-20 MG/5ML suspension 30 mL  30 mL Oral PRN Lurena Nida, NP      . clonazePAM [name]) tablet 1 mg  1 mg Oral TID Nena Polio, PA-C   1 mg at 08/18/13 1156  . lamoTRIgine (LAMICTAL) tablet 50 mg  50 mg Oral BID Nena Polio, PA-C      . magnesium hydroxide (MILK OF MAGNESIA) suspension 30 mL  30 mL Oral Daily PRN  Lurena Nida, NP      . traZODone (DESYREL) tablet 50 mg  50 mg Oral QHS PRN Lurena Nida, NP   50 mg at 08/17/13 2110    Lab Results: No results found for this or any previous visit (from the past 48 hour(s)).  Physical Findings: AIMS: Facial and Oral Movements Muscles of Facial Expression: None, normal Lips and Perioral Area: None, normal Jaw: None, normal Tongue: None, normal,Extremity Movements Upper (arms, wrists, hands, fingers): None, normal Lower (legs, knees, ankles, toes): None, normal, Trunk Movements Neck, shoulders, hips: None, normal, Overall Severity Severity of abnormal movements (highest score from questions above): None, normal Incapacitation due to abnormal movements: None, normal Patient's awareness of abnormal movements (rate only patient's report): No Awareness, Dental Status Current problems with teeth and/or dentures?: No Does patient usually wear dentures?: No  CIWA:  CIWA-Ar Total: 0 COWS:     Treatment Plan Summary: Daily contact with patient to assess and evaluate symptoms and progress in treatment Medication management  Plan: 1. Will increase the Lamictal to 50 mg po qd and plan to d/c in AM if no complications. 2. Continue all groups and plan follow up as directed.  Medical Decision Making Problem Points:  Established problem, stable/improving (1) Data Points:  Review or order medicine tests (1)  I certify that inpatient services furnished can reasonably be expected to improve the patient's condition.   MASHBURN,NEIL 08/18/2013, 12:00 PM  Reviewed the information documented and agree with the treatment plan.  Masaye Gatchalian,[name]. 08/19/2013 2:50 PM

## 2013-08-18 NOTE — Progress Notes (Signed)
Patient ID: Richard Heath, male   DOB: 1990/11/11, 23 y.o.   MRN: [id number] D. The patient is depressed and anxious. He makes only brief eye contact when he speaks. Reports his visit did not go well with his mother, but did not wish to elaborate. Minimal interaction in the milieu. A. Met with patient to assess. Verbal support provided. Encouraged to attend evening wrap up group. Administered HS medication. R. The patient attended evening wrap up group with minimal participation. Requested his sleep medication immediately after group because he wanted to go to bed. Denied suicidal ideation. Will continue to monitor.

## 2013-08-18 NOTE — Progress Notes (Signed)
Patient in day room interacting with peers. Mood and affects appropriate, denied SI/HI and denied Hallucinations. He reported that he will be going home tomorrow and that he feels ready for discharge. He stated that he plans to go into a facility called [facility] for grief counseling. Writer encouraged and supported patient. He's receptive to encouragement and support. Q 15 minute check continues as ordered to maintain safety.

## 2013-08-18 NOTE — Progress Notes (Signed)
Adult Psychoeducational Group Note  Date:  08/18/2013 Time:  9:31 PM  Group Topic/Focus:  Wrap-Up Group:   The focus of this group is to help patients review their daily goal of treatment and discuss progress on daily workbooks.  Participation Level:  Active  Participation Quality:  Appropriate  Affect:  Appropriate  Cognitive:  Appropriate  Insight: Appropriate  Engagement in Group:  Engaged  Modes of Intervention:  Support  Additional Comments:  Pt stated taht he was able to deal with the loss of his cousin today and learned was to see the loss in a positive way and to move on from it. advice to peers = to stay positive and to just hope that things get better because they will.   Richard Heath 08/18/2013, 9:31 PM

## 2013-08-19 DIAGNOSIS — F411 Generalized anxiety disorder: Secondary | ICD-10-CM

## 2013-08-19 DIAGNOSIS — F4325 Adjustment disorder with mixed disturbance of emotions and conduct: Secondary | ICD-10-CM

## 2013-08-19 DIAGNOSIS — F1910 Other psychoactive substance abuse, uncomplicated: Secondary | ICD-10-CM

## 2013-08-19 DIAGNOSIS — F3160 Bipolar disorder, current episode mixed, unspecified: Secondary | ICD-10-CM

## 2013-08-19 DIAGNOSIS — F4310 Post-traumatic stress disorder, unspecified: Secondary | ICD-10-CM

## 2013-08-19 MED ORDER — BUSPIRONE HCL 10 MG PO TABS
10.0000 mg | ORAL_TABLET | Freq: Three times a day (TID) | ORAL | Status: DC
  Administered 2013-08-19 – 2013-08-20 (×3): 10 mg via ORAL
  Filled 2013-08-19: qty 2
  Filled 2013-08-19 (×5): qty 1
  Filled 2013-08-19: qty 2

## 2013-08-19 MED ORDER — CLONAZEPAM 1 MG PO TABS
1.0000 mg | ORAL_TABLET | Freq: Two times a day (BID) | ORAL | Status: DC
  Administered 2013-08-20: 1 mg via ORAL
  Filled 2013-08-19: qty 1

## 2013-08-19 NOTE — [facility] Group Notes (Signed)
[facility] LCSW Group Therapy      Feelings About Diagnosis 1:15 - 2:30 PM         08/19/2013  3:24 PM    Type of Therapy:  Group Therapy  Participation Level:  Active  Participation Quality:  Appropriate  Affect:  Appropriate  Cognitive:  Alert and Appropriate  Insight:  Developing/Improving and Engaged  Engagement in Therapy:  Developing/Improving and Engaged  Modes of Intervention:  Discussion, Education, Exploration, Problem-Solving, Rapport Building, Support  Summary of Progress/Problems:  Patient actively participated in group. Patient discussed past and present diagnosis and the effects it has had on  life.  Patient talked about family and society being judgmental and the stigma associated with having a mental health diagnosis.  He shared he is okay with his diagnosis and knows he can get help.  Wynn BankerHodnett, Cathi Hazan Hairston 08/19/2013  3:24 PM

## 2013-08-19 NOTE — B[facility] Suicide Risk Assessment (Signed)
[facility] INPATIENT:  Family/Significant Other Suicide Prevention Education  Suicide Prevention Education:  Education Completed; Richard FramesJodi Heath, Mother, [phone]; has been identified by the patient as the family member/significant other with whom the patient will be residing, and identified as the person(s) who will aid the patient in the event of a mental health crisis (suicidal ideations/suicide attempt).  With written consent from the patient, the family member/significant other has been provided the following suicide prevention education, prior to the and/or following the discharge of the patient.  The suicide prevention education provided includes the following:  Suicide risk factors  Suicide prevention and interventions  National Suicide Hotline telephone number  [facility] assessment telephone number  [facility] Emergency Assistance 911  [facility] and/or Residential Mobile Crisis Unit telephone number  Request made of family/significant other to:  Remove weapons (e.g., guns, rifles, knives), all items previously/currently identified as safety concern.  Mother advised patient does not have access to guns.  Remove drugs/medications (over-the-counter, prescriptions, illicit drugs), all items previously/currently identified as a safety concern.  The family member/significant other verbalizes understanding of the suicide prevention education information provided.  The family member/significant other agrees to remove the items of safety concern listed above.  Richard Heath, Richard Heath 08/19/2013, 12:55 PM

## 2013-08-19 NOTE — Progress Notes (Signed)
Patient ID: Richard Heath, male   DOB: 05-30-1990, 23 y.o.   MRN: [id number] [facility] MD Progress Note  08/19/2013 6:26 PM Richard Heath  MRN:  [id number] Subjective:  Met  With patient and MD today tod discuss his plan of care and goals for treatment. Patient states he slept poorly thinking he was going home today. He states he feels better and that the klonopin helps with his anxiety.  He reports plans to honor his friend who died 5 years ago tomorrow by releasing balloons or some other act to memorialize his dead friend. He also has plans to discuss job core in [place] as well as a potential to go to college at the [employer] as well.   Objective:  Richard Heath continues to minimize his symptoms and states that the klonopin is helping. He does want to see an out patient therapist when he is discharged.  He shows little insight into his problem and becomes tearful at the suggestion that he stay one more day to have an antidepressant or antianxiety medication added to his regimen.  He feels that if his friend is not honored on the anniversary of his death that it won't mean as much.  He goes on to state that his mother would want him to stay.  He reluctantly agrees to stay but is definitely upset and tearful. Diagnosis:   DSM5: DSM5:  Obsessive-Compulsive Disorders: Organizing, cleaning  Trauma-Stressor Disorders: Posttraumatic Stress Disorder (309.81)  Substance/Addictive Disorders: Cannabis Use Disorder - Mild (305.20)  Depressive Disorders: Major Depressive Disorder - Severe (296.23)  AXIS I: Adjustment Disorder with Mixed Disturbance of Emotions and Conduct, Bipolar, mixed, Generalized Anxiety Disorder, Post Traumatic Stress Disorder and Substance Abuse  AXIS II: Deferred  AXIS III:  Past Medical History   Diagnosis  Date   .  Asthma    .  Anxiety    .  Panic attack    .  Hypertension    .  GAD (generalized anxiety disorder)    .  OCD (obsessive compulsive disorder)      AXIS IV: economic problems, occupational problems, other psychosocial or environmental problems and problems related to social environment  AXIS V: 41-50 serious symptoms  Total Time spent with patient: 45 minutes ADL's:  Intact  Sleep: Good  Appetite:  Good  Suicidal Ideation:  denies Homicidal Ideation:  denies AEB (as evidenced by):  Psychiatric Specialty Exam: Physical Exam  ROS  Blood pressure 120/84, pulse 70, temperature 98.1 F (36.7 C), temperature source Oral, resp. rate 18, height 6' (1.829 m), weight 97.977 kg (216 lb).Body mass index is 29.29 kg/(m^2).  General Appearance: Casual  Eye Contact::  Fair  Speech:  Clear and Coherent  Volume:  Normal  Mood:  Anxious and Depressed  Affect:  Tearful  Thought Process:  Goal Directed  Orientation:  Full (Time, Place, and Person)  Thought Content:  WDL  Suicidal Thoughts:  Heath  Homicidal Thoughts:  Heath  Memory:  NA  Judgement:  Impaired  Insight:  Shallow  Psychomotor Activity:  Increased and Restlessness  Concentration:  Fair  Recall:  Richard Heath of Knowledge:Fair  Language: Good  Akathisia:  Heath  Handed:  Right  AIMS (if indicated):     Assets:  Communication Skills Desire for Improvement Housing Physical Health  Sleep:  Number of Hours: 1.5   Musculoskeletal: Strength & Muscle Tone: within normal limits Gait & Station: normal Patient leans: N/A  Current Medications: Current Facility-Administered Medications  Medication Dose  Route Frequency Provider Last Rate Last Dose  . acetaminophen (TYLENOL) tablet 650 mg  650 mg Oral Q6H PRN Richard Nida, NP      . alum & mag hydroxide-simeth (MAALOX/MYLANTA) 200-200-20 MG/5ML suspension 30 mL  30 mL Oral PRN Richard Nida, NP      . clonazePAM Richard Heath) tablet 1 mg  1 mg Oral TID Richard Polio, PA-C   1 mg at 08/19/13 1618  . lamoTRIgine (LAMICTAL) tablet 50 mg  50 mg Oral BID Richard Polio, PA-C   50 mg at 08/19/13 1618  . magnesium hydroxide (MILK OF  MAGNESIA) suspension 30 mL  30 mL Oral Daily PRN Richard Nida, NP      . traZODone (DESYREL) tablet 50 mg  50 mg Oral QHS PRN Richard Nida, NP   50 mg at 08/18/13 2116    Lab Results: Heath results found for this or any previous visit (from the past 48 hour(s)).  Physical Findings: AIMS: Facial and Oral Movements Muscles of Facial Expression: None, normal Lips and Perioral Area: None, normal Jaw: None, normal Tongue: None, normal,Extremity Movements Upper (arms, wrists, hands, fingers): None, normal Lower (legs, knees, ankles, toes): None, normal, Trunk Movements Neck, shoulders, hips: None, normal, Overall Severity Severity of abnormal movements (highest score from questions above): None, normal Incapacitation due to abnormal movements: None, normal Patient's awareness of abnormal movements (rate only patient's report): Heath Awareness, Dental Status Current problems with teeth and/or dentures?: Heath Does patient usually wear dentures?: Heath  CIWA:  CIWA-Ar Total: 5 COWS:  COWS Total Score: 4  Treatment Plan Summary: Daily contact with patient to assess and evaluate symptoms and progress in treatment Medication management  Plan: 1. Will continue the Lamictal to 50 mg po qd.  2. Continue all groups and plan follow up as directed. 3. Will add buspar 63m po TID for anxiety. 4/ Reviewed risks benefits symptoms and side effects of Buspar and Klonopin. 5. Will decrease klonopin to 120mpo BID. 6. Will consider d/c in AM if Heath contraindications.  Medical Decision Making Problem Points:  Established problem, stable/improving (1) Data Points:  Review or order medicine tests (1)  I certify that inpatient services furnished can reasonably be expected to improve the patient's condition.    [facility]/31/2015 6:36 PM  Reviewed the information documented and agree with the treatment plan.  Richard Heath,Richard R. 08/20/2013 11:41 AM

## 2013-08-19 NOTE — Progress Notes (Signed)
Adult Psychoeducational Group Note  Date:  08/19/2013 Time:  11:37 PM  Group Topic/Focus:  Wrap-Up Group:   The focus of this group is to help patients review their daily goal of treatment and discuss progress on daily workbooks.  Participation Level:  Active  Participation Quality:  Appropriate  Affect:  Appropriate  Cognitive:  Appropriate  Insight: Appropriate  Engagement in Group:  Engaged  Modes of Intervention:  Discussion  Additional Comments:  Patient was present for group. Patient stated that he had a good day because he got to spend some time with his mom. He stated that previously, they had communication issues, but he is on medication now that helps him feel more calm. He states that she is a positive support for him.   Rosilyn MingsMingia, Charman Blasco A 08/19/2013, 11:37 PM

## 2013-08-19 NOTE — Progress Notes (Signed)
The focus of this group is to educate the patient on the purpose and policies of crisis stabilization and provide a format to answer questions about their admission.  The group details unit policies and expectations of patients while admitted.  Patient attended [id number] nurse education orientation group this morning.  Patient listened attentively, alert, appropriate insight and engagement.  Today patient will work on 3 goals for discharge.

## 2013-08-19 NOTE — Progress Notes (Signed)
Patient ID: Richard MaduraJames Heath, male   DOB: [birth year], 23 y.o.   MRN: [id number] Pt  Visible in the milieu.  Interacting appropriately with staff and peers.  Attending groups.  Pt reported he was upset earlier today because he was not discharged but understood that he needed to stay due to change in medication.  Pt hopeful that he will be discharged tomorrow.  Needs assessed.  Pt denied.  Support provided.  Pt receptive.  Fifteen minute checks in progress for patient safety.  Pt safe on unit.

## 2013-08-19 NOTE — Progress Notes (Signed)
D:  Patient's self inventory sheet, patient has poor sleep, good appetite, normal energy level, good attention span.  Denied depression and hopeless.  Rated anxiety 2.  Denied SI.  Denied physical problems.  Denied pain.  Plans to return home after discharge.  Has insurance to help with his medications after discharge. A:  Medications administered per MD orders.   Emotional support and encouragement given patient. R:  Denied SI and HI, contracts for safety.  Denied A/V hallucinations.  Will continue to monitor patient for safety with 15 minute checks.  Safety maintained. Patient wanted to be discharged today and became upset and tearful after being told that he could not be discharged today.  Patient discussed with MD.

## 2013-08-19 NOTE — Progress Notes (Signed)
Recreation Therapy Notes   Animal-Assisted Activity/Therapy (AAA/T) Program Checklist/Progress Notes Patient Eligibility Criteria Checklist & Daily Group note for Rec Tx Intervention  Date: 03.30.2015 Time: 2:45pm Location: [address] Dayroom    AAA/T Program Assumption of Risk Form signed by Patient/ or Parent Legal Guardian yes  Patient is free of allergies or sever asthma yes  Patient reports no fear of animals yes  Patient reports no history of cruelty to animals yes   Patient understands his/her participation is voluntary yes  Patient washes hands before animal contact yes  Patient washes hands after animal contact yes  Behavioral Response: Engaged, Appropriate   Education: Hand Washing, Appropriate Animal Interaction   Education Outcome: Acknowledges understanding   Clinical Observations/Feedback: Patient pet therapy dog and interacted appropriately with peers.   Marykay Lexenise L Parnika Tweten, LRT/CTRS  Deontre Allsup L 08/19/2013 5:09 PM

## 2013-08-20 MED ORDER — TRAZODONE HCL 50 MG PO TABS: 50.0000 mg | ORAL_TABLET | Freq: Every evening | ORAL | Status: DC | PRN

## 2013-08-20 MED ORDER — CLONAZEPAM 1 MG PO TABS: 1.0000 mg | ORAL_TABLET | Freq: Two times a day (BID) | ORAL | Status: DC

## 2013-08-20 MED ORDER — LAMOTRIGINE 25 MG PO TABS: 50.0000 mg | ORAL_TABLET | Freq: Two times a day (BID) | ORAL | Status: DC

## 2013-08-20 MED ORDER — BUSPIRONE HCL 10 MG PO TABS: 10.0000 mg | ORAL_TABLET | Freq: Three times a day (TID) | ORAL | Status: DC

## 2013-08-20 NOTE — [facility] Group Notes (Signed)
[facility]   08/20/2013 9:52 AM    Participation Quality:  Appropraite  Mood/Affect:  Appropriate  Depression Rating:  0  Anxiety Rating:  2  Thoughts of Suicide:  No  Will you contract for safety?   NA  Current AVH:  No  Plan for Discharge/Comments:  Patient attended discharge planning group and actively participated in group.  He reports doing well and hopes to discharge today.  [facility] [place].   CSW provided all participants with daily workbook.   Transportation Means: Patient has transportation.   Supports:  Patient has a support system.   Richard Heath, Richard JulyQuylle Heath

## 2013-08-20 NOTE — Progress Notes (Signed)
Pt asleep. Even, unlabored breathing. No signs of distress not. q 15 min safety checks. Pt remains safe on unit 
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
## 2013-08-20 NOTE — Progress Notes (Signed)
[facility] Discharge [facility] :  Will you be returning to the same living situation after discharge: Yes,  Patient return to his home. At discharge, do you have transportation home?:Yes,  Patient to arrange transportation home. Do you have the ability to pay for your medications:Yes,  Patient able to obtain medications  Release of information consent forms completed and in the chart;  Patient's signature needed at discharge.  Patient to Follow up at: Follow-up Information   Follow up with Dr. Tenny Crawoss -  [facility] On 08/27/2013. (Wednesday, August 27, 2013 at 3:00 PM)    Contact information:   [address] [place], [place]   [address]  [phone]      Follow up with Florencia Reasonseggy Bynum -   [facility] On 09/04/2013. (Thursday September 04, 2013 at 9:45)    Contact information:   [address] [place], [place]   [id number]  [phone]      Patient denies SI/HI:   Patient no longer endorsing SI/HI or other thoughts of self harm.  Safety Planning and Suicide Prevention discussed:  .Reviewed with all patients during discharge planning group   [facility], Richard JulyQuylle Heath 08/20/2013, 10:35 AM

## 2013-08-20 NOTE — [facility] Suicide Risk Assessment (Signed)
   Demographic Factors:  Male, Adolescent or young adult, Caucasian and Low socioeconomic status  Total Time spent with patient: 30 minutes  Psychiatric Specialty Exam: Physical Exam  ROS  Blood pressure 133/92, pulse 94, temperature 97.7 F (36.5 C), temperature source Oral, resp. rate 16, height 6' (1.829 m), weight 97.977 kg (216 lb).Body mass index is 29.29 kg/(m^2).  General Appearance: Casual  Eye Contact::  Good  Speech:  Clear and Coherent  Volume:  Normal  Mood:  Euthymic  Affect:  Appropriate and Congruent  Thought Process:  Coherent and Goal Directed  Orientation:  Full (Time, Place, and Person)  Thought Content:  WDL  Suicidal Thoughts:  No  Homicidal Thoughts:  No  Memory:  Immediate;   Fair  Judgement:  Intact  Insight:  Fair  Psychomotor Activity:  Normal  Concentration:  Good  Recall:  Fair  Fund of Knowledge:Good  Language: Good  Akathisia:  NA  Handed:  Right  AIMS (if indicated):     Assets:  Communication Skills Desire for Improvement Financial Resources/Insurance Housing Intimacy Leisure Time Physical Health Resilience Social Support Talents/Skills Transportation  Sleep:  Number of Hours: 1.5    Musculoskeletal: Strength & Muscle Tone: within normal limits Gait & Station: normal Patient leans: N/A   Mental Status Per Nursing Assessment::   On Admission:  NA (denies si/hi/av presently)  Current Mental Status by Physician: NA  Loss Factors: Loss of significant relationship and Financial problems/change in socioeconomic status  Historical Factors: NA  Risk Reduction Factors:   Sense of responsibility to family, Religious beliefs about death, Living with another person, especially a relative, Positive social support, Positive therapeutic relationship and Positive coping skills or problem solving skills  Continued Clinical Symptoms:  Bipolar Disorder:   Bipolar II Depressive phase Depression:   Recent sense of  peace/wellbeing Unstable or Poor Therapeutic Relationship Previous Psychiatric Diagnoses and Treatments  Cognitive Features That Contribute To Risk:  Polarized thinking    Suicide Risk:  Minimal: No identifiable suicidal ideation.  Patients presenting with no risk factors but with morbid ruminations; may be classified as minimal risk based on the severity of the depressive symptoms  Discharge Diagnoses:   AXIS I:  Bipolar, Depressed AXIS II:  Deferred AXIS III:   Past Medical History  Diagnosis Date  . Asthma   . Anxiety   . Panic attack   . Hypertension   . GAD (generalized anxiety disorder)   . OCD (obsessive compulsive disorder)    AXIS IV:  other psychosocial or environmental problems, problems related to social environment and problems with primary support group AXIS V:  61-70 mild symptoms  Plan Of Care/Follow-up recommendations:  Activity:  as tolerated Diet:  regular  Is patient on multiple antipsychotic therapies at discharge:  No   Has Patient had three or more failed trials of antipsychotic monotherapy by history:  No  Recommended Plan for Multiple Antipsychotic Therapies: NA    Makaelyn Aponte,[name]. 08/20/2013, 1:12 PM

## 2013-08-20 NOTE — Tx Team (Signed)
Interdisciplinary Treatment Plan Update   Date Reviewed:  08/20/2013  Time Reviewed:  8:37 AM  Progress in Treatment:   Attending groups: Yes Participating in groups: Yes Taking medication as prescribed: Yes  Tolerating medication: Yes Family/Significant other contact made:  Yes, collateral contact made with mother. Patient understands diagnosis: Yes  Discussing patient identified problems/goals with staff: Yes Medical problems stabilized or resolved: Yes Denies suicidal/homicidal ideation: Yes Patient has not harmed self or others: Yes  For review of initial/current patient goals, please see plan of care.  Estimated Length of Stay:  Discharge today  Reasons for Continued Hospitalization:   New Problems/Goals identified:    Discharge Plan or Barriers:   Home with [facility]  Additional Comments:  Attendees:  Patient: [facility] 08/20/2013 8:37 AM   Signature: Mervyn GayJ. Jonnalagadda, MD 08/20/2013 8:37 AM  Signature:   08/20/2013 8:37 AM  Signature:  Claudette Headonrad Withrow, NP  08/20/2013 8:37 AM  Signature:  Quintella ReichertBeverly Knight, RN 08/20/2013 8:37 AM  Signature:  Harold Barbanonecia Byrd, RN 08/20/2013 8:37 AM  Signature:  Juline PatchQuylle Duayne Brideau, LCSW 08/20/2013 8:37 AM  Signature:  Reyes Ivanhelsea Horton, LCSW 08/20/2013 8:37 AM  Signature:  Leisa LenzValerie Enoch, Care Coordinator [facility] 08/20/2013 8:37 AM  Signature:  Aloha GellKrista Dopson, RN 08/20/2013 8:37 AM  Signature:  08/20/2013  8:37 AM  Signature:   Onnie BoerJennifer Clark, RN [facility] 08/20/2013  8:37 AM  Signature:   08/20/2013  8:37 AM    Scribe for Treatment Team:   Juline PatchQuylle Mico Spark,  08/20/2013 8:37 AM

## 2013-08-20 NOTE — Progress Notes (Signed)
Discharge Note: Discharge instructions and prescriptions given to patient. Patient verbalized understanding of discharge instructions and prescriptions. Returned belongings to patient. Denies SI/HI/AVH. Patient d/c without incident to the lobby and transported home by mother.

## 2013-08-21 NOTE — Progress Notes (Signed)
Patient Discharge Instructions:  Next Level Care Provider Has Access to the EMR, 08/21/13 Records provided to [facility] via [facility]/Epic access.  Jerelene ReddenSheena E Pass Christian, 08/21/2013, 3:40 PM

## 2013-08-27 ENCOUNTER — Ambulatory Visit (INDEPENDENT_AMBULATORY_CARE_PROVIDER_SITE_OTHER): Payer: Federal, State, Local not specified - PPO | Admitting: Psychiatry

## 2013-08-27 ENCOUNTER — Encounter (HOSPITAL_COMMUNITY): Payer: Self-pay | Admitting: Psychiatry

## 2013-08-27 VITALS — BP 130/90 | Ht 72.0 in | Wt 220.0 lb

## 2013-08-27 DIAGNOSIS — F411 Generalized anxiety disorder: Secondary | ICD-10-CM

## 2013-08-27 DIAGNOSIS — F3162 Bipolar disorder, current episode mixed, moderate: Secondary | ICD-10-CM

## 2013-08-27 MED ORDER — LAMOTRIGINE 25 MG PO TABS: 50.0000 mg | ORAL_TABLET | Freq: Two times a day (BID) | ORAL | Status: DC

## 2013-08-27 MED ORDER — CLONAZEPAM 1 MG PO TABS: 1.0000 mg | ORAL_TABLET | Freq: Three times a day (TID) | ORAL | Status: DC

## 2013-08-27 MED ORDER — TRAZODONE HCL 50 MG PO TABS: 50.0000 mg | ORAL_TABLET | Freq: Every evening | ORAL | Status: DC | PRN

## 2013-08-27 MED ORDER — BUSPIRONE HCL 10 MG PO TABS: 10.0000 mg | ORAL_TABLET | Freq: Three times a day (TID) | ORAL | Status: DC

## 2013-08-27 NOTE — Progress Notes (Signed)
Patient ID: Richard Heath, male   DOB: 1990/09/25, 23 y.o.   MRN: [id number]  Psychiatric Assessment Adult  Patient Identification:  Richard Heath Date of Evaluation:  08/27/2013 Chief Complaint: "I'm very anxious and having panic attacks History of Chief Complaint:   Chief Complaint  Patient presents with  . Anxiety  . Depression  . Manic Behavior  . Follow-up    Anxiety Symptoms include decreased concentration and nervous/anxious behavior.     this patient is a 23 year old single white male who lives with his mother, mother's boyfriend. The boyfriend's mother and his uncle with cerebral palsy in [place]. 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  The patient is self-referred. He's been seen in the emergency room at [facility] a couple of times in the last year. He states that he moved down here from [place] about 6 months ago to live with his mother. He's had significant panic and anxiety problems ever since he can remember. As a child he was anxious and worried often. He worried about the weather and about his parents health. He did not have significant separation anxiety.  As he got older the panic attacks have worsened. He left high school in the 12th grade because being around the other kids was bothering him. He got a GED. In the past he was being treated by his family doctor and was tried on Zoloft and Wellbutrin without much success. Lorazepam did not help. He went to our emergency room last summer and was given a short prescription for clonazepam which he did think was helpful.  Currently he is having panic attacks twice a day. They're characterized by tachycardia sweating feeling sick and anxious. He is nervous and jittery the rest of the time and having difficulty sleeping. His thoughts race. 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 He has made some friends  here and has been playing basketball. He denies being depressed or sad. He's never been suicidal or homicidal. He denies auditory or visual sensations or paranoia. He was smoking marijuana in the past but has not done this in several months he denies the use of alcohol or any other drugs  The patient returns after approximately 2 weeks. He was seen here on March 26 and was started on clonazepam. However on March 28 he was admitted to the behavioral health hospital. Apparently he and his mother had an argument about money. He became depressed and agitated. He also started thinking about her cousin who died in his arms 5 months ago in [place]. Her cousin was abusing heroin. The patient became suicidal.  While in the hospital he was started on BuSpar and Lamictal and trazodone along with the clonazepam. These medications have seemed to help quite a bit and his mood is more stable and he is no longer suicidal or depressed. He felt like the grief counseling helped him tremendously. Review of Systems  Constitutional: Negative.   HENT: Negative.   Eyes: Negative.   Respiratory: Negative.   Cardiovascular: Negative.   Gastrointestinal: Negative.   Endocrine: Negative.   Genitourinary: Negative.   Musculoskeletal: Negative.   Skin: Negative.   Allergic/Immunologic: Negative.   Neurological: Negative.   Hematological: Negative.   Psychiatric/Behavioral: Positive for sleep disturbance and decreased concentration. The patient is nervous/anxious.    Physical Exam not done  Depressive Symptoms: insomnia, anxiety, panic attacks,  (Hypo)  Manic Symptoms:   Elevated Mood:  No Irritable Mood:  No Grandiosity:  No Distractibility:  Yes Labiality of Mood:  No Delusions:  No Hallucinations:  No Impulsivity:  No Sexually Inappropriate Behavior:  No Financial Extravagance:  No Flight of Ideas:  No  Anxiety Symptoms: Excessive Worry:  Yes Panic Symptoms:  Yes Agoraphobia:  No Obsessive  Compulsive: Yes  Symptoms: Obsessive cleaning Specific Phobias:  No Social Anxiety:  No  Psychotic Symptoms:  Hallucinations: No None Delusions:  No Paranoia:  No   Ideas of Reference:  No  PTSD Symptoms: Ever had a traumatic exposure:  No Had a traumatic exposure in the last month:  No Re-experiencing: No None Hypervigilance:  No Hyperarousal: No None Avoidance: No None  Traumatic Brain Injury: No   Past Psychiatric History: Diagnosis: Generalized anxiety disorder   Hospitalizations: none  Outpatient Care: Only through family physician   Substance Abuse Care: none  Self-Mutilation: none  Suicidal Attempts:none  Violent Behaviors: none   Past Medical History:   Past Medical History  Diagnosis Date  . Asthma   . Anxiety   . Panic attack   . Hypertension   . GAD (generalized anxiety disorder)   . OCD (obsessive compulsive disorder)    History of Loss of Consciousness:  No Seizure History:  No Cardiac History:  No Allergies:  No Known Allergies Current Medications:  Current Outpatient Prescriptions  Medication Sig Dispense Refill  . busPIRone (BUSPAR) 10 MG tablet Take 1 tablet (10 mg total) by mouth 3 (three) times daily.  90 tablet  2  . clonazePAM (KLONOPIN) 1 MG tablet Take 1 tablet (1 mg total) by mouth 3 (three) times daily.  90 tablet  2  . lamoTRIgine (LAMICTAL) 25 MG tablet Take 2 tablets (50 mg total) by mouth 2 (two) times daily.  120 tablet  2  . traZODone (DESYREL) 50 MG tablet Take 1 tablet (50 mg total) by mouth at bedtime as needed for sleep.  30 tablet  2   No current facility-administered medications for this visit.    Previous Psychotropic Medications:  Medication Dose     Zoloft Wellbutrin and Ativan                      Substance Abuse History in the last 12 months: Substance Age of 1st Use Last Use Amount Specific Type  Nicotine      Alcohol      Cannabis      Opiates      Cocaine      Methamphetamines      LSD      Ecstasy       Benzodiazepines      Caffeine      Inhalants      Others:                          Medical Consequences of Substance Abuse: n/a  Legal Consequences of Substance Abuse:n/a  Family Consequences of Substance Abuse: n/a  Blackouts:  No DT's:  No Withdrawal Symptoms:  No None  Social History: Current Place of Residence: [place] Place of Birth: [place] Family Members: Mother father one older brother Marital Status:  Single Children:  None Relationships:  Education:  GED Educational Problems/Performance:  Religious Beliefs/Practices: Christian History of Abuse: none Occupational Experiences; applying to work at [employer] Military History:  None. Legal History: Tiketed once for trespassing Hobbies/Interests: Following sports, playing  basketball  Family History:   Family History  Problem Relation Age of Onset  . Anxiety disorder Father   . Alcohol abuse Father   . Anxiety disorder Maternal Aunt   . Anxiety disorder Cousin     Mental Status Examination/Evaluation: Objective:  Appearance: Casual and Well Groomed  Patent attorney::  Fair  Speech:  Pressured  Volume:  Normal  Mood:  Anxious  Affect:  Congruent  Thought Process:  Goal Directed  Orientation:  Full (Time, Place, and Person)  Thought Content:  WDL  Suicidal Thoughts:  No  Homicidal Thoughts:  No  Judgement:  Good  Insight:  Good  Psychomotor Activity:  Restlessness  Akathisia:  No  Handed:  Right  AIMS (if indicated):    Assets:  Communication Skills Desire for Improvement Physical Health Social Support    Laboratory/X-Ray Psychological Evaluation(s)        Assessment:  Axis I: Generalized Anxiety Disorder  AXIS I Generalized Anxiety Disorder  AXIS II Deferred  AXIS III Past Medical History  Diagnosis Date  . Asthma   . Anxiety   . Panic attack   . Hypertension   . GAD (generalized anxiety disorder)   . OCD (obsessive compulsive disorder)      AXIS  IV other psychosocial or environmental problems  AXIS V 51-60 moderate symptoms   Treatment Plan/Recommendations:  Plan of Care: Medication management   Laboratory  Psychotherapy: He'll be starting counseling with Florencia Reasons per the hospital discharge   Medications: The patient continue clonazepam 1 mg 3 times a day on a scheduled basis .he'll also continued BuSpar Lamictal and trazodone.   Routine PRN Medications:  No  Consultations:   Safety Concerns:    Other:  She will return in four-week's     Diannia Ruder, MD 4/8/20153:23 PM

## 2013-09-04 ENCOUNTER — Ambulatory Visit (INDEPENDENT_AMBULATORY_CARE_PROVIDER_SITE_OTHER): Payer: Federal, State, Local not specified - PPO | Admitting: Psychiatry

## 2013-09-04 DIAGNOSIS — F411 Generalized anxiety disorder: Secondary | ICD-10-CM

## 2013-09-04 DIAGNOSIS — F329 Major depressive disorder, single episode, unspecified: Secondary | ICD-10-CM

## 2013-09-08 NOTE — Patient Instructions (Signed)
Discussed orally 
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
## 2013-09-11 ENCOUNTER — Ambulatory Visit (HOSPITAL_COMMUNITY): Payer: Self-pay | Admitting: Psychiatry

## 2013-09-17 ENCOUNTER — Telehealth (HOSPITAL_COMMUNITY): Payer: Self-pay | Admitting: *Deleted

## 2013-09-17 ENCOUNTER — Ambulatory Visit (INDEPENDENT_AMBULATORY_CARE_PROVIDER_SITE_OTHER): Payer: Federal, State, Local not specified - PPO | Admitting: Psychiatry

## 2013-09-17 DIAGNOSIS — F329 Major depressive disorder, single episode, unspecified: Secondary | ICD-10-CM

## 2013-09-17 DIAGNOSIS — F411 Generalized anxiety disorder: Secondary | ICD-10-CM

## 2013-09-17 NOTE — Progress Notes (Signed)
   THERAPIST PROGRESS NOTE  Session Time: Wednesday 09/17/2013 11:15 AM - 11:45 AM  Participation Level: Active  Behavioral Response: CasualAlertAnxious  Type of Therapy: Individual Therapy  Treatment Goals addressed: Improve ability to manage stress and anxiety  Interventions: Supportive  Summary: Richard Heath is a 23 y.o. male who was referred for services upon discharge from hospitalization at the [facility] where he was treated for suicidal thoughts and depression in March 2015. Patient reports this was triggered by increased thoughts of his cousin who died in his arms of a drug overdose on 08/20/2009. At the time, patient lived in [place]. He moved to [place] about 6 months ago to stay with his mother and try to have a fresh start. He reports intrusive thoughts, nightmares,and flashbacks. Patient expresses anxiety about the relationship with his cousin's mother as she pressures him for more information about the incident. Patient states not wanting to get in the middle of it as he fears mother would pursue legal charges. He reports financial stress as he has been unable to find a job. However, he plans to join [employer]. Patient states having anxiety since he was a child and states he used to be afraid of thunder storms. He has panic attacks that have decreased to 2 x a week since taking medication.  Since last session, patient reports improved mood. He states things have been going well  and states having a job interview yesterday. He also has met with someone regarding [employer] and hopes to be able to join program very soon. He is very anxious about being able to leave soon as his mother's boyfriend's 19 year old son is a drug addict and keeps trying to get patient to use drugs with him. Patient also reports he is very irritating and constantly asking questions. He states having to hide his valuables. Patient is very stressed by living situation due to this and home  being overcrowded. He expresses relief as he has talked with deceased cousin's mother about events surrounding cousins death.  Suicidal/Homicidal: No  Therapist Response: Therapist works with patient to process feelings, identify ways to improve self-care, and practice relaxation technique using diaphragmatic breathing.  Plan: Return again in 2 weeks.  Diagnosis: Axis I: MDD, GAD    Axis II: Deferred    BYNUM,PEGGY, LCSW 09/17/2013

## 2013-09-17 NOTE — Patient Instructions (Signed)
Discussed orally 

## 2013-09-17 NOTE — Telephone Encounter (Signed)
done

## 2013-09-24 ENCOUNTER — Encounter (HOSPITAL_COMMUNITY): Payer: Self-pay | Admitting: Psychiatry

## 2013-09-24 ENCOUNTER — Telehealth (HOSPITAL_COMMUNITY): Payer: Self-pay | Admitting: *Deleted

## 2013-09-24 ENCOUNTER — Ambulatory Visit (INDEPENDENT_AMBULATORY_CARE_PROVIDER_SITE_OTHER): Payer: Federal, State, Local not specified - PPO | Admitting: Psychiatry

## 2013-09-24 VITALS — BP 140/90 | Ht 72.0 in | Wt 219.0 lb

## 2013-09-24 DIAGNOSIS — F411 Generalized anxiety disorder: Secondary | ICD-10-CM

## 2013-09-24 DIAGNOSIS — F329 Major depressive disorder, single episode, unspecified: Secondary | ICD-10-CM

## 2013-09-24 DIAGNOSIS — F3162 Bipolar disorder, current episode mixed, moderate: Secondary | ICD-10-CM

## 2013-09-24 MED ORDER — TRAZODONE HCL 100 MG PO TABS: 100.0000 mg | ORAL_TABLET | Freq: Every day | ORAL | Status: DC

## 2013-09-24 NOTE — Telephone Encounter (Signed)
Increase is correct

## 2013-09-24 NOTE — Progress Notes (Signed)
Patient ID: Richard MaduraJames Heath, male   DOB: 10-Oct-1990, 23 y.o.   MRN: [id number] Patient ID: Richard MaduraJames Oka, male   DOB: 10-Oct-1990, 23 y.o.   MRN: [id number]  Psychiatric Assessment Adult  Patient Identification:  Richard MaduraJames Heath Date of Evaluation:  09/24/2013 Chief Complaint: "I'm very anxious and having panic attacks History of Chief Complaint:   Chief Complaint  Patient presents with  . Anxiety  . Depression  . Manic Behavior  . Follow-up    Anxiety Symptoms include decreased concentration and nervous/anxious behavior.     this patient is a 23 year old single white male who lives with his mother, mother's boyfriend. The boyfriend's mother and his uncle with cerebral palsy in [place]. 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  The patient is self-referred. He's been seen in the emergency room at [facility] a couple of times in the last year. He states that he moved down here from [place] about 6 months ago to live with his mother. He's had significant panic and anxiety problems ever since he can remember. As a child he was anxious and worried often. He worried about the weather and about his parents health. He did not have significant separation anxiety.  As he got older the panic attacks have worsened. He left high school in the 12th grade because being around the other kids was bothering him. He got a GED. In the past he was being treated by his family doctor and was tried on Zoloft and Wellbutrin without much success. Lorazepam did not help. He went to our emergency room last summer and was given a short prescription for clonazepam which he did think was helpful.  Currently he is having panic attacks twice a day. They're characterized by tachycardia sweating feeling sick and anxious. He is nervous and jittery the rest of the time and having difficulty sleeping. His thoughts race. He's not sure why is anxious because he is enjoying his life here with  his mother and looking forward to joining job corps. He has made some friends here and has been playing basketball. He denies being depressed or sad. He's never been suicidal or homicidal. He denies auditory or visual sensations or paranoia. He was smoking marijuana in the past but has not done this in several months he denies the use of alcohol or any other drugs  The patient returns after approximately four-week's. He's doing very well. He is applied to job corps. He stayed away from marijuana and all of the drugs and alcohol. His mood is been stable and he said no thoughts of hurting self or others. He claims that he has some trouble sleeping and would like a slightly higher dose of trazodone. Review of Systems  Constitutional: Negative.   HENT: Negative.   Eyes: Negative.   Respiratory: Negative.   Cardiovascular: Negative.   Gastrointestinal: Negative.   Endocrine: Negative.   Genitourinary: Negative.   Musculoskeletal: Negative.   Skin: Negative.   Allergic/Immunologic: Negative.   Neurological: Negative.   Hematological: Negative.   Psychiatric/Behavioral: Positive for sleep disturbance and decreased concentration. The patient is nervous/anxious.    Physical Exam not done  Depressive Symptoms: insomnia, anxiety, panic attacks,  (Hypo) Manic Symptoms:   Elevated Mood:  No Irritable Mood:  No Grandiosity:  No Distractibility:  Yes Labiality of Mood:  No Delusions:  No Hallucinations:  No Impulsivity:  No Sexually Inappropriate Behavior:  No Financial Extravagance:  No Flight of Ideas:  No  Anxiety  Symptoms: Excessive Worry:  Yes Panic Symptoms:  Yes Agoraphobia:  No Obsessive Compulsive: Yes  Symptoms: Obsessive cleaning Specific Phobias:  No Social Anxiety:  No  Psychotic Symptoms:  Hallucinations: No None Delusions:  No Paranoia:  No   Ideas of Reference:  No  PTSD Symptoms: Ever had a traumatic exposure:  No Had a traumatic exposure in the last month:   No Re-experiencing: No None Hypervigilance:  No Hyperarousal: No None Avoidance: No None  Traumatic Brain Injury: No   Past Psychiatric History: Diagnosis: Generalized anxiety disorder   Hospitalizations: none  Outpatient Care: Only through family physician   Substance Abuse Care: none  Self-Mutilation: none  Suicidal Attempts:none  Violent Behaviors: none   Past Medical History:   Past Medical History  Diagnosis Date  . Asthma   . Anxiety   . Panic attack   . Hypertension   . GAD (generalized anxiety disorder)   . OCD (obsessive compulsive disorder)    History of Loss of Consciousness:  No Seizure History:  No Cardiac History:  No Allergies:  No Known Allergies Current Medications:  Current Outpatient Prescriptions  Medication Sig Dispense Refill  . busPIRone (BUSPAR) 10 MG tablet Take 1 tablet (10 mg total) by mouth 3 (three) times daily.  90 tablet  2  . clonazePAM (KLONOPIN) 1 MG tablet Take 1 tablet (1 mg total) by mouth 3 (three) times daily.  90 tablet  2  . lamoTRIgine (LAMICTAL) 25 MG tablet Take 2 tablets (50 mg total) by mouth 2 (two) times daily.  120 tablet  2  . traZODone (DESYREL) 100 MG tablet Take 1 tablet (100 mg total) by mouth at bedtime.  30 tablet  2  . traZODone (DESYREL) 50 MG tablet Take 1 tablet (50 mg total) by mouth at bedtime as needed for sleep.  30 tablet  2   No current facility-administered medications for this visit.    Previous Psychotropic Medications:  Medication Dose     Zoloft Wellbutrin and Ativan                      Substance Abuse History in the last 12 months: Substance Age of 1st Use Last Use Amount Specific Type  Nicotine      Alcohol      Cannabis      Opiates      Cocaine      Methamphetamines      LSD      Ecstasy      Benzodiazepines      Caffeine      Inhalants      Others:                          Medical Consequences of Substance Abuse: n/a  Legal Consequences of Substance  Abuse:n/a  Family Consequences of Substance Abuse: n/a  Blackouts:  No DT's:  No Withdrawal Symptoms:  No None  Social History: Current Place of Residence: [place] Place of Birth: [place] Family Members: Mother father one older brother Marital Status:  Single Children:  None Relationships:  Education:  GED Educational Problems/Performance:  Religious Beliefs/Practices: Christian History of Abuse: none Occupational Experiences; applying to work at [employer] Military History:  None. Legal History: Tiketed once for trespassing Hobbies/Interests: Following sports, playing basketball  Family History:   Family History  Problem Relation Age of Onset  . Anxiety disorder Father   . Alcohol abuse Father   .  Anxiety disorder Maternal Aunt   . Anxiety disorder Cousin     Mental Status Examination/Evaluation: Objective:  Appearance: Casual and Well Groomed  Patent attorney::  Fair  Speech:  Pressured  Volume:  Normal  Mood:  Slightly anxious  Affect:  Congruent  Thought Process:  Goal Directed  Orientation:  Full (Time, Place, and Person)  Thought Content:  WDL  Suicidal Thoughts:  No  Homicidal Thoughts:  No  Judgement:  Good  Insight:  Good  Psychomotor Activity:  Restlessness  Akathisia:  No  Handed:  Right  AIMS (if indicated):    Assets:  Communication Skills Desire for Improvement Physical Health Social Support    Laboratory/X-Ray Psychological Evaluation(s)        Assessment:  Axis I: Generalized Anxiety Disorder  AXIS I Generalized Anxiety Disorder  AXIS II Deferred  AXIS III Past Medical History  Diagnosis Date  . Asthma   . Anxiety   . Panic attack   . Hypertension   . GAD (generalized anxiety disorder)   . OCD (obsessive compulsive disorder)      AXIS IV other psychosocial or environmental problems  AXIS V 51-60 moderate symptoms   Treatment Plan/Recommendations:  Plan of Care: Medication management   Laboratory   Psychotherapy: He'll be starting counseling with Florencia Reasons per the hospital discharge   Medications: The patient continue clonazepam 1 mg 3 times a day on a scheduled basis .he'll also continued BuSpar Lamictal and trazodone. The trazodone will be increased to 5200 mg each bedtime   Routine PRN Medications:  No  Consultations:   Safety Concerns:    Other:  She will return in 6 weeks     Diannia Ruder, MD 5/6/20152:08 PM

## 2013-10-17 ENCOUNTER — Inpatient Hospital Stay (HOSPITAL_COMMUNITY)
Admission: AD | Admit: 2013-10-17 | Discharge: 2013-10-20 | DRG: 880 | Disposition: A | Discharge status: Home / Self Care | Payer: Federal, State, Local not specified - PPO | Source: Intra-hospital | Attending: Psychiatry | Admitting: Psychiatry

## 2013-10-17 ENCOUNTER — Encounter (HOSPITAL_COMMUNITY): Payer: Self-pay | Admitting: Emergency Medicine

## 2013-10-17 ENCOUNTER — Emergency Department (HOSPITAL_COMMUNITY)
Admission: EM | Admit: 2013-10-17 | Discharge: 2013-10-17 | Disposition: A | Discharge status: Other Acute Inpatient Hospital | Payer: Federal, State, Local not specified - PPO | Attending: Emergency Medicine | Admitting: Emergency Medicine

## 2013-10-17 ENCOUNTER — Emergency Department (HOSPITAL_COMMUNITY)
Admission: EM | Admit: 2013-10-17 | Discharge: 2013-10-17 | Disposition: A | Discharge status: Home / Self Care | Payer: Federal, State, Local not specified - PPO | Attending: Emergency Medicine | Admitting: Emergency Medicine

## 2013-10-17 ENCOUNTER — Ambulatory Visit (INDEPENDENT_AMBULATORY_CARE_PROVIDER_SITE_OTHER): Payer: Federal, State, Local not specified - PPO | Admitting: Psychiatry

## 2013-10-17 ENCOUNTER — Encounter (HOSPITAL_COMMUNITY): Payer: Self-pay | Admitting: *Deleted

## 2013-10-17 ENCOUNTER — Emergency Department (HOSPITAL_COMMUNITY): Payer: Federal, State, Local not specified - PPO

## 2013-10-17 DIAGNOSIS — F411 Generalized anxiety disorder: Secondary | ICD-10-CM | POA: Insufficient documentation

## 2013-10-17 DIAGNOSIS — R45851 Suicidal ideations: Secondary | ICD-10-CM | POA: Insufficient documentation

## 2013-10-17 DIAGNOSIS — S1093XA Contusion of unspecified part of neck, initial encounter: Secondary | ICD-10-CM

## 2013-10-17 DIAGNOSIS — Z5987 Material hardship due to limited financial resources, not elsewhere classified: Secondary | ICD-10-CM

## 2013-10-17 DIAGNOSIS — F3130 Bipolar disorder, current episode depressed, mild or moderate severity, unspecified: Secondary | ICD-10-CM | POA: Diagnosis present

## 2013-10-17 DIAGNOSIS — S0003XA Contusion of scalp, initial encounter: Secondary | ICD-10-CM | POA: Insufficient documentation

## 2013-10-17 DIAGNOSIS — Z791 Long term (current) use of non-steroidal anti-inflammatories (NSAID): Secondary | ICD-10-CM | POA: Insufficient documentation

## 2013-10-17 DIAGNOSIS — F419 Anxiety disorder, unspecified: Secondary | ICD-10-CM

## 2013-10-17 DIAGNOSIS — Z818 Family history of other mental and behavioral disorders: Secondary | ICD-10-CM

## 2013-10-17 DIAGNOSIS — S0083XA Contusion of other part of head, initial encounter: Secondary | ICD-10-CM | POA: Insufficient documentation

## 2013-10-17 DIAGNOSIS — J45909 Unspecified asthma, uncomplicated: Secondary | ICD-10-CM | POA: Insufficient documentation

## 2013-10-17 DIAGNOSIS — F1210 Cannabis abuse, uncomplicated: Secondary | ICD-10-CM | POA: Diagnosis present

## 2013-10-17 DIAGNOSIS — Z79899 Other long term (current) drug therapy: Secondary | ICD-10-CM | POA: Insufficient documentation

## 2013-10-17 DIAGNOSIS — F429 Obsessive-compulsive disorder, unspecified: Secondary | ICD-10-CM | POA: Diagnosis present

## 2013-10-17 DIAGNOSIS — I10 Essential (primary) hypertension: Secondary | ICD-10-CM | POA: Insufficient documentation

## 2013-10-17 DIAGNOSIS — F329 Major depressive disorder, single episode, unspecified: Secondary | ICD-10-CM

## 2013-10-17 DIAGNOSIS — F319 Bipolar disorder, unspecified: Secondary | ICD-10-CM | POA: Insufficient documentation

## 2013-10-17 DIAGNOSIS — G4700 Insomnia, unspecified: Secondary | ICD-10-CM | POA: Diagnosis present

## 2013-10-17 DIAGNOSIS — F410 Panic disorder [episodic paroxysmal anxiety] without agoraphobia: Secondary | ICD-10-CM | POA: Diagnosis present

## 2013-10-17 DIAGNOSIS — Z598 Other problems related to housing and economic circumstances: Secondary | ICD-10-CM

## 2013-10-17 HISTORY — DX: Bipolar disorder, unspecified: F31.9

## 2013-10-17 LAB — CBC WITH DIFFERENTIAL/PLATELET
Basophils Absolute: 0 10*3/uL (ref 0.0–0.1)
Basophils Relative: 0 % (ref 0–1)
EOS PCT: 3 % (ref 0–5)
Eosinophils Absolute: 0.3 10*3/uL (ref 0.0–0.7)
HEMATOCRIT: 43 % (ref 39.0–52.0)
Hemoglobin: 13.9 g/dL (ref 13.0–17.0)
LYMPHS ABS: 2.5 10*3/uL (ref 0.7–4.0)
Lymphocytes Relative: 30 % (ref 12–46)
MCH: 28.3 pg (ref 26.0–34.0)
MCHC: 32.3 g/dL (ref 30.0–36.0)
MCV: 87.4 fL (ref 78.0–100.0)
MONO ABS: 1 10*3/uL (ref 0.1–1.0)
Monocytes Relative: 12 % (ref 3–12)
Neutro Abs: 4.4 10*3/uL (ref 1.7–7.7)
Neutrophils Relative %: 55 % (ref 43–77)
Platelets: 288 10*3/uL (ref 150–400)
RBC: 4.92 MIL/uL (ref 4.22–5.81)
RDW: 13 % (ref 11.5–15.5)
WBC: 8.1 10*3/uL (ref 4.0–10.5)

## 2013-10-17 LAB — COMPREHENSIVE METABOLIC PANEL
ALT: 19 U/L (ref 0–53)
AST: 27 U/L (ref 0–37)
Albumin: 4.2 g/dL (ref 3.5–5.2)
Alkaline Phosphatase: 77 U/L (ref 39–117)
BUN: 26 mg/dL — ABNORMAL HIGH (ref 6–23)
CALCIUM: 9.3 mg/dL (ref 8.4–10.5)
CO2: 26 meq/L (ref 19–32)
CREATININE: 1.2 mg/dL (ref 0.50–1.35)
Chloride: 101 mEq/L (ref 96–112)
GFR calc Af Amer: >90 mL/min (ref >90)
GFR, EST NON AFRICAN AMERICAN: 86 mL/min — AB (ref >90)
Glucose, Bld: 87 mg/dL (ref 70–99)
Potassium: 4.2 mEq/L (ref 3.7–5.3)
Sodium: 140 mEq/L (ref 137–147)
Total Bilirubin: 0.4 mg/dL (ref 0.3–1.2)
Total Protein: 7.9 g/dL (ref 6.0–8.3)

## 2013-10-17 LAB — RAPID URINE DRUG SCREEN, HOSP PERFORMED
Amphetamines: NOT DETECTED
Barbiturates: NOT DETECTED
Benzodiazepines: POSITIVE — AB
Cocaine: NOT DETECTED
OPIATES: NOT DETECTED
Tetrahydrocannabinol: POSITIVE — AB

## 2013-10-17 LAB — ETHANOL

## 2013-10-17 MED ORDER — IBUPROFEN 400 MG PO TABS: 600.0000 mg | ORAL_TABLET | Freq: Four times a day (QID) | ORAL | Status: DC | PRN

## 2013-10-17 MED ORDER — TRAZODONE HCL 100 MG PO TABS
100.0000 mg | ORAL_TABLET | Freq: Every day | ORAL | Status: DC
  Administered 2013-10-17 – 2013-10-18 (×2): 100 mg via ORAL
  Filled 2013-10-17 (×4): qty 1

## 2013-10-17 MED ORDER — KETOROLAC TROMETHAMINE 60 MG/2ML IM SOLN
60.0000 mg | Freq: Once | INTRAMUSCULAR | Status: AC
  Administered 2013-10-17: 60 mg via INTRAMUSCULAR
  Filled 2013-10-17: qty 2

## 2013-10-17 MED ORDER — BUSPIRONE HCL 10 MG PO TABS
10.0000 mg | ORAL_TABLET | Freq: Three times a day (TID) | ORAL | Status: DC
  Administered 2013-10-18 – 2013-10-20 (×8): 10 mg via ORAL
  Filled 2013-10-17 (×2): qty 1
  Filled 2013-10-17: qty 42
  Filled 2013-10-17: qty 1
  Filled 2013-10-17: qty 42
  Filled 2013-10-17 (×5): qty 1
  Filled 2013-10-17: qty 42
  Filled 2013-10-17 (×2): qty 1

## 2013-10-17 MED ORDER — CLONAZEPAM 0.5 MG PO TABS
2.0000 mg | ORAL_TABLET | Freq: Once | ORAL | Status: AC
  Administered 2013-10-17: 2 mg via ORAL
  Filled 2013-10-17: qty 4

## 2013-10-17 MED ORDER — TRAZODONE HCL 50 MG PO TABS: 100.0000 mg | ORAL_TABLET | Freq: Every day | ORAL | Status: DC

## 2013-10-17 MED ORDER — IBUPROFEN 800 MG PO TABS
800.0000 mg | ORAL_TABLET | Freq: Once | ORAL | Status: AC
  Administered 2013-10-17: 800 mg via ORAL
  Filled 2013-10-17: qty 1

## 2013-10-17 MED ORDER — LORAZEPAM 1 MG PO TABS: 1.0000 mg | ORAL_TABLET | Freq: Three times a day (TID) | ORAL | Status: DC | PRN

## 2013-10-17 MED ORDER — CLONAZEPAM 1 MG PO TABS
1.0000 mg | ORAL_TABLET | Freq: Three times a day (TID) | ORAL | Status: DC
  Administered 2013-10-18 – 2013-10-19 (×4): 1 mg via ORAL
  Filled 2013-10-17 (×4): qty 1

## 2013-10-17 MED ORDER — MAGNESIUM HYDROXIDE 400 MG/5ML PO SUSP: 30.0000 mL | Freq: Every day | ORAL | Status: DC | PRN

## 2013-10-17 MED ORDER — ACETAMINOPHEN 500 MG PO TABS: 1000.0000 mg | ORAL_TABLET | Freq: Four times a day (QID) | ORAL | Status: DC | PRN

## 2013-10-17 MED ORDER — ZOLPIDEM TARTRATE 5 MG PO TABS: 10.0000 mg | ORAL_TABLET | Freq: Every evening | ORAL | Status: DC | PRN

## 2013-10-17 MED ORDER — LAMOTRIGINE 25 MG PO TABS
50.0000 mg | ORAL_TABLET | Freq: Two times a day (BID) | ORAL | Status: DC
  Administered 2013-10-17: 50 mg via ORAL
  Filled 2013-10-17 (×3): qty 2

## 2013-10-17 MED ORDER — ALUM & MAG HYDROXIDE-SIMETH 200-200-20 MG/5ML PO SUSP: 30.0000 mL | ORAL | Status: DC | PRN

## 2013-10-17 MED ORDER — NAPROXEN 500 MG PO TABS: 500.0000 mg | ORAL_TABLET | Freq: Two times a day (BID) | ORAL | Status: DC

## 2013-10-17 MED ORDER — OLANZAPINE 10 MG PO TBDP
10.0000 mg | ORAL_TABLET | Freq: Once | ORAL | Status: AC
  Administered 2013-10-17: 10 mg via ORAL
  Filled 2013-10-17 (×2): qty 1

## 2013-10-17 MED ORDER — BUSPIRONE HCL 5 MG PO TABS
10.0000 mg | ORAL_TABLET | Freq: Three times a day (TID) | ORAL | Status: DC
  Administered 2013-10-17: 10 mg via ORAL
  Filled 2013-10-17 (×3): qty 2

## 2013-10-17 MED ORDER — ONDANSETRON HCL 4 MG PO TABS: 4.0000 mg | ORAL_TABLET | Freq: Three times a day (TID) | ORAL | Status: DC | PRN

## 2013-10-17 MED ORDER — NICOTINE 21 MG/24HR TD PT24: 21.0000 mg | MEDICATED_PATCH | Freq: Every day | TRANSDERMAL | Status: DC

## 2013-10-17 MED ORDER — CLONAZEPAM 0.5 MG PO TABS: 1.0000 mg | ORAL_TABLET | Freq: Three times a day (TID) | ORAL | Status: DC

## 2013-10-17 MED ORDER — ACETAMINOPHEN 325 MG PO TABS
650.0000 mg | ORAL_TABLET | Freq: Four times a day (QID) | ORAL | Status: DC | PRN
Start: 2013-10-17 — End: 2013-10-20
  Administered 2013-10-18: 650 mg via ORAL
  Filled 2013-10-17: qty 2

## 2013-10-17 MED ORDER — LAMOTRIGINE 25 MG PO TABS
50.0000 mg | ORAL_TABLET | Freq: Two times a day (BID) | ORAL | Status: DC
  Administered 2013-10-18 – 2013-10-20 (×5): 50 mg via ORAL
  Filled 2013-10-17 (×6): qty 2
  Filled 2013-10-17: qty 28
  Filled 2013-10-17 (×2): qty 2
  Filled 2013-10-17: qty 28

## 2013-10-17 MED ORDER — LORAZEPAM 1 MG PO TABS
1.0000 mg | ORAL_TABLET | Freq: Once | ORAL | Status: AC
  Administered 2013-10-17: 1 mg via ORAL
  Filled 2013-10-17: qty 1

## 2013-10-17 NOTE — ED Notes (Signed)
IVC papers not taken out, pt calm and wants to go to [facility].

## 2013-10-17 NOTE — Progress Notes (Signed)
Attended group 

## 2013-10-17 NOTE — Discharge Instructions (Signed)
Xray without fractures

## 2013-10-17 NOTE — ED Notes (Signed)
Pt alert & oriented x4, stable gait. Patient given discharge instructions, paperwork & prescription(s). Patient  instructed to stop at the registration desk to finish any additional paperwork. Patient verbalized understanding. Pt left department w/ no further questions. 
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
## 2013-10-17 NOTE — Progress Notes (Signed)
23 year old male pt admitted on voluntary basis. Pt reports that he has been depressed and suicidal and endorsed that he had a plan to shoot himself in the head. Pt reports that he was here a few months ago and that it was beneficial and feels that now his medications are not working for him. Pt denies SI on admission and is able to contract for safety on the unit. Pt was oriented to the unit and safety maintained.
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
## 2013-10-17 NOTE — BH Assessment (Signed)
[facility] Assessment Progress Note  Called and scheduled pt's tele assessment with this clinician with pt's nurse, Tammy Sours.  Also, called and got clinical for the pt from [facility] @ 1357.  Pt to be seen via tele assessment @ 1410 by this clinician.  Casimer Lanius, MS, [facility] Licensed Professional Counselor Triage Specialist

## 2013-10-17 NOTE — ED Notes (Signed)
Pt refused ice pack and motrin

## 2013-10-17 NOTE — BH Assessment (Signed)
[facility] Assessment Progress Note  Per Thurman Coyer, RN, [facility], pt has been accepted to [facility] to the service of Geoffery Lyons, MD, Rm [id number].  Pt's nurse has been notified.  He agrees to have pt sign Voluntary Admission and Consent for Treatment, to fax the signed document to [facility] [phone]), to send the original with the pt, and call nurse-to-nurse report to [phone].  Doylene Canning, MA Triage Specialist 10/17/2013 @ 16:47

## 2013-10-17 NOTE — ED Notes (Signed)
[employer] is sending a driver to transport to [facility].
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
## 2013-10-17 NOTE — ED Notes (Signed)
Pt. C/o bleeding inside mouth. Slight bleeding noted to inside of left cheek. EDP notified.

## 2013-10-17 NOTE — Progress Notes (Signed)
Patient ID: Richard Heath, male   DOB: 20-Feb-1991, 23 y.o.   MRN: [id number]  Pt given medications as ordered; pt calmer and apologetic for prior behaviors. No distress noted. Pt went to his room to rest.
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
## 2013-10-17 NOTE — ED Notes (Signed)
[place] left with pt, paperwork secured.

## 2013-10-17 NOTE — Patient Instructions (Signed)
Discussed orally 

## 2013-10-18 DIAGNOSIS — R45850 Homicidal ideations: Secondary | ICD-10-CM

## 2013-10-18 DIAGNOSIS — F429 Obsessive-compulsive disorder, unspecified: Secondary | ICD-10-CM

## 2013-10-18 DIAGNOSIS — F411 Generalized anxiety disorder: Secondary | ICD-10-CM

## 2013-10-18 MED ORDER — LORAZEPAM 1 MG PO TABS
ORAL_TABLET | ORAL | Status: AC
  Administered 2013-10-18: 16:00:00
  Filled 2013-10-18: qty 1

## 2013-10-18 MED ORDER — QUETIAPINE FUMARATE 50 MG PO TABS
50.0000 mg | ORAL_TABLET | Freq: Every day | ORAL | Status: DC
  Administered 2013-10-18 – 2013-10-19 (×2): 50 mg via ORAL
  Filled 2013-10-18 (×4): qty 1
  Filled 2013-10-18: qty 14

## 2013-10-18 MED ORDER — OLANZAPINE 10 MG PO TBDP
ORAL_TABLET | ORAL | Status: AC
  Filled 2013-10-18: qty 1

## 2013-10-18 MED ORDER — OLANZAPINE 10 MG PO TBDP
10.0000 mg | ORAL_TABLET | Freq: Once | ORAL | Status: AC
  Administered 2013-10-18: 16:00:00 via ORAL
  Filled 2013-10-18: qty 1

## 2013-10-18 MED ORDER — LORAZEPAM 1 MG PO TABS: 1.0000 mg | ORAL_TABLET | Freq: Once | ORAL | Status: DC

## 2013-10-18 NOTE — [facility] Group Notes (Signed)
[facility] Group Notes:  (Clinical Social Work)  10/18/2013   1:15-2:15PM  Summary of Progress/Problems:   The main focus of today's process group was for the patient to identify ways in which they have sabotaged their own mental health wellness/recovery.  Motivational interviewing and a handout were used to explore the benefits and costs of their self-sabotaging behavior as well as the benefits and costs of changing this behavior.  The Stages of Change were explained to the group using a handout, and patients identified where they are with regard to changing self-defeating behaviors.  The patient expressed he self-sabotages with smoking weed and being a people pleaser.  He was irritable and argumentative for the first few minutes of group, and laid back with his eyes close.  He abruptly left and did not return.  Type of Therapy:  Process Group  Participation Level:  Active  Participation Quality:  Inattentive and Resistant  Affect:  Defensive, Depressed, Flat and Irritable  Cognitive:  Lacking  Insight:  Defensive and Lacking  Engagement in Therapy:  Lacking  Modes of Intervention:  Education, Motivational Interviewing   Ambrose Mantle, LCSW 10/18/2013, 4:00pm

## 2013-10-18 NOTE — [facility] Group Notes (Signed)
[facility] Group Notes:  (Nursing/MHT/Case Management/Adjunct)  Date:  10/18/2013  Time:  11:21 AM  Type of Therapy:  Psychoeducational Skills  Participation Level:  Active  Participation Quality:  Appropriate  Affect:  Appropriate  Cognitive:  Appropriate  Insight:  Appropriate  Engagement in Group:  Engaged  Modes of Intervention:  Discussion  Summary of Progress/Problems: Pt did attend self inventory group, pt reported that he was neg SI, no AH/VH noted. Pt did report being positive HI, however would not state who he was having the HI towards. Pt rated his depression as a 0, and his helplessness/hopelessness as a 0.     Pt reported no concerns.   Richard Heath 10/18/2013, 11:21 AM

## 2013-10-18 NOTE — Progress Notes (Signed)
Adult Psychoeducational Group Note  Date:  10/18/2013 Time:  3:48 PM  Group Topic/Focus:  Recovery Goals:   The focus of this group is to identify appropriate goals for recovery and establish a plan to achieve them.  Participation Level:  Did Not Attend    Additional Comments:  Patient was informed that group was about to start but did not come to group. Patient did not offer a reason for not attending group.  Merleen Milliner 10/18/2013, 3:48 PM

## 2013-10-18 NOTE — Progress Notes (Addendum)
Richard Heath is seen OOB UAL on the [address] today tolerated fair today. He is flat, blunted and says " I need my medicine changed".   A HE attends his groups. HE has minimal insight but says " I don't know what's wrong with me..I  take my medicine but it just doesn't work... He completes his self inventory and on it he writes he deniesSI within the past 24 hrs, he rates his depression and hopelessness "1/1" and says his DC plan is to go to" counseling". R Pt  Became very agitated this afternoon . He requested to sign 72 request for DC and this was done. His anxiety continued tio escalate tot he point tha the was agitated, saying " I just want to go...you people cannot hoel me and I said I want to go..ibuprofen'm just going to go home"... He took his belongings in a bag and stood at locked unit doors and attempted to get door open ( even though it was locked) . He was then given a one time prn dose of ativan 1 mg po and 10 mg po zyprexa zydis and then started sobbing saying over and over and over " I'm sorry I acted out...i'm sorry if I was ugly to you". About 10 minutes later, pt observed lying in his bed, in NAD.

## 2013-10-18 NOTE — Progress Notes (Signed)
Psychoeducational Group Note  Date: 10/18/2013 Time:  1015  Group Topic/Focus:  Identifying Needs:   The focus of this group is to help patients identify their personal needs that have been historically problematic and identify healthy behaviors to address their needs.  Participation Level:  Active  Participation Quality:  Appropriate  Affect:  Appropriate  Cognitive:  Oriented  Insight:  Improving  Engagement in Group:  Engaged  Additional Comments:  Pt was attentive and involved in the group.  Kerstin Crusoe A  

## 2013-10-18 NOTE — [facility] Suicide Risk Assessment (Signed)
Suicide Risk Assessment  Admission Assessment     Nursing information obtained from:    Demographic factors:    Current Mental Status:   see below Loss Factors:   not working Historical Factors:   impulsive Risk Reduction Factors:   lives with family Total Time spent with patient: 20 minutes  CLINICAL FACTORS:   Severe Anxiety and/or Agitation  Psychiatric Specialty Exam:     Blood pressure 129/84, pulse 80, temperature 97.2 F (36.2 C), temperature source Oral, resp. rate 20, height 5\' 9"  (1.753 m), weight 98.884 kg (218 lb).Body mass index is 32.18 kg/(m^2).  General Appearance: Disheveled  Eye Contact::  Poor  Speech:  Slow  Volume:  Decreased  Mood:  Depressed  Affect:  Blunt  Thought Process:  Logical  Orientation:  Negative  Thought Content:  Negative  Suicidal Thoughts:  No  Homicidal Thoughts:  No  Memory:  NA  Judgement:  Poor  Insight:  Shallow  Psychomotor Activity:  Decreased  Concentration:  Poor  Recall:  Poor  Fund of Knowledge:Poor  Language: Poor  Akathisia:  No  Handed:  Right  AIMS (if indicated):     Assets:  Desire for Improvement  Sleep:      Musculoskeletal: Strength & Muscle Tone: within normal limits Gait & Station: normal Patient leans: N/A  COGNITIVE FEATURES THAT CONTRIBUTE TO RISK:  Closed-mindedness    SUICIDE RISK:   Mild:  Suicidal ideation of limited frequency, intensity, duration, and specificity.  There are no identifiable plans, no associated intent, mild dysphoria and related symptoms, good self-control (both objective and subjective assessment), few other risk factors, and identifiable protective factors, including available and accessible social support.  PLAN OF CARE:  Continue current meds  I certify that inpatient services furnished can reasonably be expected to improve the patient's condition.  Wonda Cerise 10/18/2013, 12:11 PM
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
## 2013-10-18 NOTE — Progress Notes (Signed)
Did not attend group 

## 2013-10-18 NOTE — Progress Notes (Signed)
Patient ID: Richard Heath, male   DOB: 01/15/1991, 23 y.o.   MRN: [id number]  D: Patient angry and verbally abusive to staff at the beginning of shift. Pt did, however, calm down and was able to be out in milieu without incident. A: Q 15 minute safety checks, encourage staff/peer interaction and group participation. Administer medications as ordered by MD. R: Patient compliant with medications and attended group session. Pt verbally contracts for safety while on unit. No s/s of distress noted this shift.

## 2013-10-18 NOTE — H&P (Signed)
  Pt was seen by me today and I agree with the key elements documented in H&P.  

## 2013-10-19 MED ORDER — LORAZEPAM 1 MG PO TABS
1.0000 mg | ORAL_TABLET | Freq: Three times a day (TID) | ORAL | Status: DC | PRN
Start: 2013-10-19 — End: 2013-10-20
  Administered 2013-10-19 – 2013-10-20 (×3): 1 mg via ORAL
  Filled 2013-10-19 (×3): qty 1

## 2013-10-19 MED ORDER — OLANZAPINE 10 MG PO TBDP
10.0000 mg | ORAL_TABLET | Freq: Three times a day (TID) | ORAL | Status: DC | PRN
  Administered 2013-10-19 (×2): 10 mg via ORAL
  Filled 2013-10-19 (×2): qty 1
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
## 2013-10-19 NOTE — BHH Group Notes (Signed)
BHH Group Notes:  (Clinical Social Work)  10/19/2013   1:15-2:15PM  Summary of Progress/Problems:  The main focus of today's process group was to decide on additional supports that can be put in place to help patients remain mentally healthy and in the community.  There was an extensive discussion about what constitutes a healthy support versus an unhealthy support. An emphasis was placed on using counselor, doctor, therapy groups, 12-step groups, and problem-specific support groups to expand supports.    The patient expressed irritation with everything said in the first part of group, including CSW's description to a new patient of the 500 hall as "focused on mood disorders".  He left quickly and did not return.  Type of Therapy:  Process Group  Participation Level:  Minimal  Participation Quality:  Resistant and Angry  Affect:  Blunted  Cognitive:  Alert  Insight:  Poor  Engagement in Therapy:  Poor  Modes of Intervention:  Education,  Support and Processing  Ambrose Mantle, LCSW 10/19/2013, 4:00pm

## 2013-10-19 NOTE — Progress Notes (Signed)
D Sederick is OOB UAL on the 500 hall today..he tolerates this well. HE is anxious and uptight and talks a lot about his fear of " getting out of control again". He takes his medications as scheduled and he is attending his groups as planned.   A HE met with NP today and his klonopin is changed to ativan 1 mg  and this is explained to him. HE is also given zyprexa zydis 10 mg with the ativan  And he is given this for aniety and mood disregulation  And  after he slept about 1 hr, he was calm organized and in control when he woke up. HE stated he wants to continue to take the zydis.   R he  Completes his self inventory and on it he writes he denies SI within the past 24 hrs, he rates his depression andhopelessness "1/1" and says his DC plans include " counseling". POC cont.

## 2013-10-19 NOTE — Progress Notes (Signed)
[facility] MD Progress Note  10/19/2013 10:33 AM Richard Heath  MRN:  [id number] Subjective:  This is a 23 year old caucasian male who was admitted for anxiety.  Today he rated his anxiety 7/10 and 10 being severe anxiety.  He denied any form of depression and stated if his anxiety can be controlled he will go home and have a normal life.  Patient reported to this writer that his Clonazepam is not helping his anxiety.  He has been given Olanzapine 10 mg po  X 2  and 1 mg Ativan both times for anxiety and patient reported it worked very well keeping him calm.  On consult with Dr Scherrie Merritts, we both agreed on keeping patient on PRN Olanzapine Zydis.  Patient is also in agrement with this plan. He denied SI/HI/AVH and he plans to move back home to his family when discharged.  Patient is encouraged to stay away from Marijuana due to the effect it has on the brain neurotransmitter especially when conbined with prescribed medications.  Diagnosis:  GENERALIZED ANXIETY DISORDER, OBSSESSIVE COMPULSIVE DISORDER DSM5: Schizophrenia Disorders:  Obsessive-Compulsive Disorders:   Trauma-Stressor Disorders:   Substance/Addictive Disorders:   Depressive Disorders:   Total Time spent with patient: 20 minutes  Axis I: Generalized Anxiety Disorder and Obsessive Compulsive Disorder Axis II: Deferred Axis III:  Past Medical History  Diagnosis Date  . Asthma   . Anxiety   . Panic attack   . Hypertension   . GAD (generalized anxiety disorder)   . OCD (obsessive compulsive disorder)   . Bipolar 1 disorder    Axis IV: other psychosocial or environmental problems and problems related to social environment Axis V: 51-60 moderate symptoms  ADL's:  Intact  Sleep: Good  Appetite:  Good   Psychiatric Specialty Exam: Physical Exam  ROS  Blood pressure 124/81, pulse 102, temperature 97.8 F (36.6 C), temperature source Oral, resp. rate 18, height 5' 9"  (1.753 m), weight 98.884 kg (218 lb).Body mass index is 32.18  kg/(m^2).  General Appearance: Casual and Fairly Groomed  Engineer, water::  Good  Speech:  Clear and Coherent and Normal Rate  Volume:  Normal  Mood:  Anxious  Affect:  Congruent  Thought Process:  Coherent and Goal Directed  Orientation:  Full (Time, Place, and Person)  Thought Content:  WDL  Suicidal Thoughts:  No  Homicidal Thoughts:  No  Memory:  Immediate;   Good Recent;   Good Remote;   Good  Judgement:  Good  Insight:  Good  Psychomotor Activity:  Normal  Concentration:  Good  Recall:  NA  Fund of Knowledge:Good  Language: Good  Akathisia:  NA  Handed:  Right  AIMS (if indicated):     Assets:  Desire for Improvement  Sleep:  Number of Hours: 5.5   Musculoskeletal: Strength & Muscle Tone: within normal limits Gait & Station: normal Patient leans: N/A  Current Medications: Current Facility-Administered Medications  Medication Dose Route Frequency Provider Last Rate Last Dose  . acetaminophen (TYLENOL) tablet 650 mg  650 mg Oral Q6H PRN Lurena Nida, NP   650 mg at 10/18/13 1207  . alum & mag hydroxide-simeth (MAALOX/MYLANTA) 200-200-20 MG/5ML suspension 30 mL  30 mL Oral Q4H PRN Lurena Nida, NP      . busPIRone (BUSPAR) tablet 10 mg  10 mg Oral TID Lurena Nida, NP   10 mg at 10/19/13 0820  . clonazePAM (KLONOPIN) tablet 1 mg  1 mg Oral TID Lurena Nida, NP  1 mg at 10/19/13 0820  . lamoTRIgine (LAMICTAL) tablet 50 mg  50 mg Oral BID Lurena Nida, NP   50 mg at 10/19/13 0820  . LORazepam (ATIVAN) tablet 1 mg  1 mg Oral Once Delfin Gant, NP      . magnesium hydroxide (MILK OF MAGNESIA) suspension 30 mL  30 mL Oral Daily PRN Lurena Nida, NP      . OLANZapine zydis (ZYPREXA) disintegrating tablet 10 mg  10 mg Oral Once Delfin Gant, NP      . QUEtiapine (SEROQUEL) tablet 50 mg  50 mg Oral QHS Lurena Nida, NP   50 mg at 10/18/13 2344    Lab Results:  Results for orders placed during the hospital encounter of 10/17/13 (from the past 48 hour(s))   CBC WITH DIFFERENTIAL     Status: None   Collection Time    10/17/13 11:28 AM      Result Value Ref Range   WBC 8.1  4.0 - 10.5 K/uL   RBC 4.92  4.22 - 5.81 MIL/uL   Hemoglobin 13.9  13.0 - 17.0 g/dL   HCT 43.0  39.0 - 52.0 %   MCV 87.4  78.0 - 100.0 fL   MCH 28.3  26.0 - 34.0 pg   MCHC 32.3  30.0 - 36.0 g/dL   RDW 13.0  11.5 - 15.5 %   Platelets 288  150 - 400 K/uL   Neutrophils Relative % 55  43 - 77 %   Neutro Abs 4.4  1.7 - 7.7 K/uL   Lymphocytes Relative 30  12 - 46 %   Lymphs Abs 2.5  0.7 - 4.0 K/uL   Monocytes Relative 12  3 - 12 %   Monocytes Absolute 1.0  0.1 - 1.0 K/uL   Eosinophils Relative 3  0 - 5 %   Eosinophils Absolute 0.3  0.0 - 0.7 K/uL   Basophils Relative 0  0 - 1 %   Basophils Absolute 0.0  0.0 - 0.1 K/uL  COMPREHENSIVE METABOLIC PANEL     Status: Abnormal   Collection Time    10/17/13 11:28 AM      Result Value Ref Range   Sodium 140  137 - 147 mEq/L   Potassium 4.2  3.7 - 5.3 mEq/L   Chloride 101  96 - 112 mEq/L   CO2 26  19 - 32 mEq/L   Glucose, Bld 87  70 - 99 mg/dL   BUN 26 (*) 6 - 23 mg/dL   Creatinine, Ser 1.20  0.50 - 1.35 mg/dL   Calcium 9.3  8.4 - 10.5 mg/dL   Total Protein 7.9  6.0 - 8.3 g/dL   Albumin 4.2  3.5 - 5.2 g/dL   AST 27  0 - 37 U/L   ALT 19  0 - 53 U/L   Alkaline Phosphatase 77  39 - 117 U/L   Total Bilirubin 0.4  0.3 - 1.2 mg/dL   GFR calc non Af Amer 86 (*) >90 mL/min   GFR calc Af Amer >90  >90 mL/min   Comment: (NOTE)     The eGFR has been calculated using the CKD EPI equation.     This calculation has not been validated in all clinical situations.     eGFR's persistently <90 mL/min signify possible Chronic Kidney     Disease.  ETHANOL     Status: None   Collection Time    10/17/13 11:28 AM  Result Value Ref Range   Alcohol, Ethyl (B) <11  0 - 11 mg/dL   Comment:            LOWEST DETECTABLE LIMIT FOR     SERUM ALCOHOL IS 11 mg/dL     FOR MEDICAL PURPOSES ONLY  URINE RAPID DRUG SCREEN (HOSP PERFORMED)      Status: Abnormal   Collection Time    10/17/13 11:38 AM      Result Value Ref Range   Opiates NONE DETECTED  NONE DETECTED   Cocaine NONE DETECTED  NONE DETECTED   Benzodiazepines POSITIVE (*) NONE DETECTED   Amphetamines NONE DETECTED  NONE DETECTED   Tetrahydrocannabinol POSITIVE (*) NONE DETECTED   Barbiturates NONE DETECTED  NONE DETECTED   Comment:            DRUG SCREEN FOR MEDICAL PURPOSES     ONLY.  IF CONFIRMATION IS NEEDED     FOR ANY PURPOSE, NOTIFY LAB     WITHIN 5 DAYS.                LOWEST DETECTABLE LIMITS     FOR URINE DRUG SCREEN     Drug Class       Cutoff (ng/mL)     Amphetamine      1000     Barbiturate      200     Benzodiazepine   716     Tricyclics       967     Opiates          300     Cocaine          300     THC              50    Physical Findings: AIMS: Facial and Oral Movements Muscles of Facial Expression: None, normal Lips and Perioral Area: None, normal Jaw: None, normal Tongue: None, normal,Extremity Movements Upper (arms, wrists, hands, fingers): None, normal Lower (legs, knees, ankles, toes): None, normal, Trunk Movements Neck, shoulders, hips: None, normal, Overall Severity Severity of abnormal movements (highest score from questions above): None, normal Incapacitation due to abnormal movements: None, normal Patient's awareness of abnormal movements (rate only patient's report): No Awareness, Dental Status Current problems with teeth and/or dentures?: No Does patient usually wear dentures?: No  CIWA:    COWS:     Treatment Plan Summary: Daily contact with patient to assess and evaluate symptoms and progress in treatment Medication management   Plan: Continue with plan of care Continue crisis management Encourage to participate in group and individual sessions Continue medication management/ and review as needed We will add Olanzapine 10 mg po every 8 hours as needed for mood/anxiety We will discontinue Clonazepam  1 mg po tid  and add Ativan 1 mg po every 8 hours as needed for anxiety Patient is encouraged to continue taking his Lamotrigine 50 mg po bib for depression and mood He will continue taking his Buspar 10 mg po tid for anxiety Discharge  Plan in progress Address health issues /V/S as needed    Medical Decision Making Problem Points:  Established problem, stable/improving (1) Data Points:  Review and summation of old records (2) Review of medication regiment & side effects (2)  I certify that inpatient services furnished can reasonably be expected to improve the patient's condition.   Delfin Gant  PMHNP-BC 10/19/2013, 10:33 AM

## 2013-10-19 NOTE — Progress Notes (Signed)
Did not attend group 

## 2013-10-19 NOTE — Progress Notes (Signed)
Psychoeducational Group Note  Date: 10/19/2013 Time: 0930 Group Topic/Focus:  Gratefulness:  The focus of this group is to help patients identify what two things they are most grateful for in their lives. What helps ground them and to center them on their work to their recovery.  Participation Level:  Active   Participation Quality:  Appropriate  Affect:  Appropriate  Cognitive:  Oriented  Insight:  Improving  Engagement in Group:  Engaged  Additional Comments:  Pt attended the group, participated and engaged.  Jodey Burbano A   

## 2013-10-19 NOTE — Progress Notes (Signed)
Psychoeducational Group Note  Date:  10/19/2013 Time:  1015  Group Topic/Focus:  Making Healthy Choices:   The focus of this group is to help patients identify negative/unhealthy choices they were using prior to admission and identify positive/healthier coping strategies to replace them upon discharge.  Participation Level:  Active  Participation Quality:  Appropriate  Affect:  Appropriate  Cognitive:  Oriented  Insight:  Improving  Engagement in Group:  Engaged   Additional Comments:  Pt has attended the group and participated.   Donnalyn Juran A 10/19/2013  

## 2013-10-19 NOTE — BHH Counselor (Incomplete)
Adult Psychosocial Assessment Update Interdisciplinary Team  Previous [facility] admissions/discharges:  Admissions Discharges  Date:08/16/13 Date:08/20/13  Date: Date:  Date: Date:  Date: Date:  Date: Date:   Changes since the last Psychosocial Assessment (including adherence to outpatient mental health and/or substance abuse treatment, situational issues contributing to decompensation and/or relapse).              Discharge Plan 1. Will you be returning to the same living situation after discharge?   Yes: No:      If no, what is your plan?           2. Would you like a referral for services when you are discharged? Yes:     If yes, for what services?  No:              Summary and Recommendations (to be completed by the evaluator)                        Signature:  Laddie Math, 10/19/2013 8:41 AM

## 2013-10-20 DIAGNOSIS — F3130 Bipolar disorder, current episode depressed, mild or moderate severity, unspecified: Secondary | ICD-10-CM

## 2013-10-20 DIAGNOSIS — F411 Generalized anxiety disorder: Principal | ICD-10-CM

## 2013-10-20 MED ORDER — BUSPIRONE HCL 10 MG PO TABS: 10.0000 mg | ORAL_TABLET | Freq: Three times a day (TID) | ORAL | Status: DC

## 2013-10-20 MED ORDER — QUETIAPINE FUMARATE 50 MG PO TABS
50.0000 mg | ORAL_TABLET | Freq: Every day | ORAL | Status: DC
Start: 2013-10-20 — End: 2013-10-23

## 2013-10-20 MED ORDER — LAMOTRIGINE 25 MG PO TABS: 50.0000 mg | ORAL_TABLET | Freq: Two times a day (BID) | ORAL | Status: DC

## 2013-10-20 NOTE — Discharge Summary (Signed)
Physician Discharge Summary Note  Patient:  Staton Markey is an 23 y.o., male MRN:  [id number] DOB:  19-Jul-1990 Patient phone:  [phone] (home)  Patient address:   [address] [place] [address],  Total Time spent with patient: 30 minutes  Date of Admission:  10/17/2013 Date of Discharge: 10/20/2013  Reason for Admission:  Suicidal ideation  Discharge Diagnoses: Active Problems:   Generalized anxiety disorder    Psychiatric Specialty Exam:   Please See D/C SRA Physical Exam  ROS  Blood pressure 124/81, pulse 102, temperature 97.8 F (36.6 C), temperature source Oral, resp. rate 18, height 5' 9"  (1.753 m), weight 98.884 kg (218 lb).Body mass index is 32.18 kg/(m^2).   Past Psychiatric History: Diagnosis:  GAD Bipolar  Hospitalizations: [facility] 07/2012  Outpatient Care:   Dr Gerri Lins  Substance Abuse Care:   None  Self-Mutilation:   Denies  Suicidal Attempts:  No  Violent Behaviors:  Denies    Discharge Diagnoses:  AXIS I: Bipolar, Depressed and Generalized Anxiety Disorder  AXIS II: Deferred  AXIS III:  Past Medical History   Diagnosis  Date   .  Asthma    .  Anxiety    .  Panic attack    .  Hypertension    .  GAD (generalized anxiety disorder)    .  OCD (obsessive compulsive disorder)    .  Bipolar 1 disorder     AXIS IV: other psychosocial or environmental problems, problems related to social environment and problems with primary support group  AXIS V: 61-70 mild symptoms     Level of Care:  OP  Hospital Course:  Patient is a 23 year old SWM who presnted to [facility] reporting worsening symptoms of depression with suicidal ideation.  Patient endorsed worsening anger, making suicidal gestures by pointing a BB gun at his head, increased risky behavior, suicidal ideations, feeling frustrated, hopeless, helpless, daily panic attacks, and reports that his current medications are not working as well as when he was most recently discharged from [facility] in patient.  The patient was accepted for admission to [facility] for further stabilization and crisis management.         Gurfateh Mcclain was admitted to the adult unit. He was evaluated and his symptoms were identified. Medication management was discussed and initiated. He was oriented to the unit and encouraged to participate in unit programming. Medical problems were identified and treated appropriately. Home medication was restarted as needed.        The patient was evaluated each day by a clinical provider to ascertain the patient's response to treatment.  Nick had a rough start to his admission by acting out and cursing at the staff. He did not CIRT but was de-escalated by accepting PO Zyprexa Zydis. He was given that on two occasions for his escalating agitation and attempting to leave the unit.  He did not require restraint or seclusion.  Providers noted that he responded well to the Zydis and he was maintained on this.        Improvement was noted by the patient's report of decreasing symptoms, improved sleep and appetite, affect, medication tolerance, behavior, and participation in unit programming.  He was asked each day to complete a self inventory noting mood, mental status, pain, new symptoms, anxiety and concerns.         He responded well to medication and being in a therapeutic and supportive environment. Positive and appropriate behavior was noted and the patient was motivated  for recovery.  The patient worked closely with the treatment team and case manager to develop a discharge plan with appropriate goals. Coping skills, problem solving as well as relaxation therapies were also part of the unit programming.         By the day of discharge he was in much improved condition than upon admission.  Symptoms were reported as significantly decreased or resolved completely. The patient denied SI/HI and voiced no AVH. He was motivated to continue taking medication with a goal of continued improvement in mental  health.          Akia Desroches was discharged home with a plan to follow up as noted below.   Consults:  None  Significant Diagnostic Studies:  None  Discharge Vitals:   Blood pressure 124/81, pulse 102, temperature 97.8 F (36.6 C), temperature source Oral, resp. rate 18, height 5' 9"  (1.753 m), weight 98.884 kg (218 lb). Body mass index is 32.18 kg/(m^2). Lab Results:   Results for orders placed during the hospital encounter of 10/17/13 (from the past 72 hour(s))  CBC WITH DIFFERENTIAL     Status: None   Collection Time    10/17/13 11:28 AM      Result Value Ref Range   WBC 8.1  4.0 - 10.5 K/uL   RBC 4.92  4.22 - 5.81 MIL/uL   Hemoglobin 13.9  13.0 - 17.0 g/dL   HCT 43.0  39.0 - 52.0 %   MCV 87.4  78.0 - 100.0 fL   MCH 28.3  26.0 - 34.0 pg   MCHC 32.3  30.0 - 36.0 g/dL   RDW 13.0  11.5 - 15.5 %   Platelets 288  150 - 400 K/uL   Neutrophils Relative % 55  43 - 77 %   Neutro Abs 4.4  1.7 - 7.7 K/uL   Lymphocytes Relative 30  12 - 46 %   Lymphs Abs 2.5  0.7 - 4.0 K/uL   Monocytes Relative 12  3 - 12 %   Monocytes Absolute 1.0  0.1 - 1.0 K/uL   Eosinophils Relative 3  0 - 5 %   Eosinophils Absolute 0.3  0.0 - 0.7 K/uL   Basophils Relative 0  0 - 1 %   Basophils Absolute 0.0  0.0 - 0.1 K/uL  COMPREHENSIVE METABOLIC PANEL     Status: Abnormal   Collection Time    10/17/13 11:28 AM      Result Value Ref Range   Sodium 140  137 - 147 mEq/L   Potassium 4.2  3.7 - 5.3 mEq/L   Chloride 101  96 - 112 mEq/L   CO2 26  19 - 32 mEq/L   Glucose, Bld 87  70 - 99 mg/dL   BUN 26 (*) 6 - 23 mg/dL   Creatinine, Ser 1.20  0.50 - 1.35 mg/dL   Calcium 9.3  8.4 - 10.5 mg/dL   Total Protein 7.9  6.0 - 8.3 g/dL   Albumin 4.2  3.5 - 5.2 g/dL   AST 27  0 - 37 U/L   ALT 19  0 - 53 U/L   Alkaline Phosphatase 77  39 - 117 U/L   Total Bilirubin 0.4  0.3 - 1.2 mg/dL   GFR calc non Af Amer 86 (*) >90 mL/min   GFR calc Af Amer >90  >90 mL/min   Comment: (NOTE)     The eGFR has been  calculated using the CKD EPI equation.  This calculation has not been validated in all clinical situations.     eGFR's persistently <90 mL/min signify possible Chronic Kidney     Disease.  ETHANOL     Status: None   Collection Time    10/17/13 11:28 AM      Result Value Ref Range   Alcohol, Ethyl (B) <11  0 - 11 mg/dL   Comment:            LOWEST DETECTABLE LIMIT FOR     SERUM ALCOHOL IS 11 mg/dL     FOR MEDICAL PURPOSES ONLY  URINE RAPID DRUG SCREEN (HOSP PERFORMED)     Status: Abnormal   Collection Time    10/17/13 11:38 AM      Result Value Ref Range   Opiates NONE DETECTED  NONE DETECTED   Cocaine NONE DETECTED  NONE DETECTED   Benzodiazepines POSITIVE (*) NONE DETECTED   Amphetamines NONE DETECTED  NONE DETECTED   Tetrahydrocannabinol POSITIVE (*) NONE DETECTED   Barbiturates NONE DETECTED  NONE DETECTED   Comment:            DRUG SCREEN FOR MEDICAL PURPOSES     ONLY.  IF CONFIRMATION IS NEEDED     FOR ANY PURPOSE, NOTIFY LAB     WITHIN 5 DAYS.                LOWEST DETECTABLE LIMITS     FOR URINE DRUG SCREEN     Drug Class       Cutoff (ng/mL)     Amphetamine      1000     Barbiturate      200     Benzodiazepine   163     Tricyclics       845     Opiates          300     Cocaine          300     THC              50    Physical Findings: AIMS: Facial and Oral Movements Muscles of Facial Expression: None, normal Lips and Perioral Area: None, normal Jaw: None, normal Tongue: None, normal,Extremity Movements Upper (arms, wrists, hands, fingers): None, normal Lower (legs, knees, ankles, toes): None, normal, Trunk Movements Neck, shoulders, hips: None, normal, Overall Severity Severity of abnormal movements (highest score from questions above): None, normal Incapacitation due to abnormal movements: None, normal Patient's awareness of abnormal movements (rate only patient's report): No Awareness, Dental Status Current problems with teeth and/or dentures?:  No Does patient usually wear dentures?: No  CIWA:    COWS:     Psychiatric Specialty Exam: See Psychiatric Specialty Exam and Suicide Risk Assessment completed by Attending Physician prior to discharge.  Discharge destination:  Home  Is patient on multiple antipsychotic therapies at discharge:  No   Has Patient had three or more failed trials of antipsychotic monotherapy by history:  No  Recommended Plan for Multiple Antipsychotic Therapies: NA     Medication List    ASK your doctor about these medications     Indication   busPIRone 10 MG tablet  Commonly known as:  BUSPAR  Take 1 tablet (10 mg total) by mouth 3 (three) times daily.   Indication:  mood stabilization     clonazePAM 1 MG tablet  Commonly known as:  KLONOPIN  Take 1 tablet (1 mg total) by mouth 3 (three) times daily.  Indication:  anxiety     lamoTRIgine 25 MG tablet  Commonly known as:  LAMICTAL  Take 2 tablets (50 mg total) by mouth 2 (two) times daily.   Indication:  mood stabilization     traZODone 100 MG tablet  Commonly known as:  DESYREL  Take 1 tablet (100 mg total) by mouth at bedtime.    Insomnia         Follow-up Information   Follow up with [facility] On 11/05/2013. (Appointment scheduled at 8:45 am on this date with Dr. Harrington Challenger for medication management)    Contact information:   [address] [place], [place] [address] Phone: [phone]      Follow up with [facility] On 11/10/2013. (Appointment scheduled at 9:45 am on this date with Maurice Small for therapy )    Contact information:   [address] [place], [place] [address] Phone: [phone]      Follow-up recommendations:   Activities: Resume activity as tolerated. Diet: Heart healthy low sodium diet Tests: Follow up testing will be determined by your out patient provider. Comments:  Patient was encouraged to avoid marijuana. Routine drug testing is recommended   Improved compliance.  Total Discharge Time:  Greater than 30 minutes.  Signed: Marlane Hatcher. [place] RPAC 10:45 AM 10/20/2013  Patient was seen face-to-face for psychiatric evaluation, suicide risk assessment and case discussed with her treatment team and physician extender. Make appropriate disposition plans and reviewed the information documented and agree with the treatment plan.  Parke Simmers Ahjanae Cassel 10/23/2013 2:01 PM

## 2013-10-20 NOTE — Progress Notes (Signed)
Pt discharged per MD orders; pt currently denies SI/HI and auditory/visual hallucinations; pt was given education by RN regarding follow-up appointments and medications and pt denied any questions or concerns about these instructions; patient had no belongings in locker; pt was then discharged to hospital lobby.

## 2013-10-20 NOTE — BHH Group Notes (Signed)
[facility] LCSW Aftercare Discharge Planning Group Note   10/20/2013 8:45 AM  Participation Quality:  Alert, Appropriate and Oriented  Mood/Affect:  Calm  Depression Rating:  0  Anxiety Rating:  0  Thoughts of Suicide:  Pt denies SI/HI  Will you contract for safety?   Yes  Current AVH:  Pt denies  Plan for Discharge/Comments:  Pt attended discharge planning group and actively participated in group.  CSW provided pt with today's workbook.  Pt reports feeling well and is hopeful to d/c today.  Pt will return home in [place] and has follow up scheduled at [facility] in [place] for outpatient medication management and therapy.  No further needs voiced by pt at this time.    Transportation Means: Pt reports access to transportation - mom will pick pt up  Supports: No supports mentioned at this time  Richard Ivan, LCSW 10/20/2013 10:04 AM

## 2013-10-20 NOTE — [facility] Suicide Risk Assessment (Signed)
   Demographic Factors:  Male, Adolescent or young adult, Caucasian, Low socioeconomic status and Unemployed  Total Time spent with patient: 30 minutes  Psychiatric Specialty Exam: Physical Exam  ROS  Blood pressure 124/81, pulse 102, temperature 97.8 F (36.6 C), temperature source Oral, resp. rate 18, height 5\' 9"  (1.753 m), weight 98.884 kg (218 lb).Body mass index is 32.18 kg/(m^2).  General Appearance: Casual  Eye Contact::  Good  Speech:  Clear and Coherent  Volume:  Normal  Mood:  Euthymic  Affect:  Appropriate and Congruent  Thought Process:  Coherent and Goal Directed  Orientation:  Full (Time, Place, and Person)  Thought Content:  WDL  Suicidal Thoughts:  No  Homicidal Thoughts:  No  Memory:  Immediate;   Good  Judgement:  Fair  Insight:  Fair  Psychomotor Activity:  Normal  Concentration:  Good  Recall:  Good  Fund of Knowledge:Good  Language: Good  Akathisia:  NA  Handed:  Right  AIMS (if indicated):     Assets:  Communication Skills Desire for Improvement Financial Resources/Insurance Housing Intimacy Leisure Time Physical Health Resilience Social Support Talents/Skills Transportation  Sleep:  Number of Hours: 6.25    Musculoskeletal: Strength & Muscle Tone: within normal limits Gait & Station: normal Patient leans: N/A   Mental Status Per Nursing Assessment::   On Admission:     Current Mental Status by Physician: NA  Loss Factors: Financial problems/change in socioeconomic status  Historical Factors: Impulsivity  Risk Reduction Factors:   Sense of responsibility to family, Religious beliefs about death, Living with another person, especially a relative, Positive social support, Positive therapeutic relationship and Positive coping skills or problem solving skills  Continued Clinical Symptoms:  Severe Anxiety and/or Agitation Bipolar Disorder:   Depressive phase Depression:   Recent sense of peace/wellbeing Unstable or Poor  Therapeutic Relationship Previous Psychiatric Diagnoses and Treatments  Cognitive Features That Contribute To Risk:  Polarized thinking    Suicide Risk:  Minimal: No identifiable suicidal ideation.  Patients presenting with no risk factors but with morbid ruminations; may be classified as minimal risk based on the severity of the depressive symptoms  Discharge Diagnoses:   AXIS I:  Bipolar, Depressed and Generalized Anxiety Disorder AXIS II:  Deferred AXIS III:   Past Medical History  Diagnosis Date  . Asthma   . Anxiety   . Panic attack   . Hypertension   . GAD (generalized anxiety disorder)   . OCD (obsessive compulsive disorder)   . Bipolar 1 disorder    AXIS IV:  other psychosocial or environmental problems, problems related to social environment and problems with primary support group AXIS V:  61-70 mild symptoms  Plan Of Care/Follow-up recommendations:  Activity:  As tolerated Diet:  Regular  Is patient on multiple antipsychotic therapies at discharge:  No   Has Patient had three or more failed trials of antipsychotic monotherapy by history:  No  Recommended Plan for Multiple Antipsychotic Therapies: NA    Richard Heath 10/20/2013, 1:11 PM

## 2013-10-20 NOTE — Progress Notes (Signed)
Writer spoke with patient 1:1 and he reports having had a good day. He is pleased with his prn medications available for his anxiety. He reports that his mother visited him on today. His goal after discharge is to continue to see his therapist and psychiatrist and take his medications. He denies si/hi/a/v hallucinations. Safety maintained with 15 min check.
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
## 2013-10-20 NOTE — [facility] Suicide Risk Assessment (Signed)
[facility] INPATIENT:  Family/Significant Other Suicide Prevention Education  Suicide Prevention Education:  Education Completed; Richard Heath - mother [phone]),  (name of family member/significant other) has been identified by the patient as the family member/significant other with whom the patient will be residing, and identified as the person(s) who will aid the patient in the event of a mental health crisis (suicidal ideations/suicide attempt).  With written consent from the patient, the family member/significant other has been provided the following suicide prevention education, prior to the and/or following the discharge of the patient.  The suicide prevention education provided includes the following:  Suicide risk factors  Suicide prevention and interventions  National Suicide Hotline telephone number  [facility] assessment telephone number  [facility] Emergency Assistance 911  [facility] and/or Residential Mobile Crisis Unit telephone number  Request made of family/significant other to:  Remove weapons (e.g., guns, rifles, knives), all items previously/currently identified as safety concern.    Remove drugs/medications (over-the-counter, prescriptions, illicit drugs), all items previously/currently identified as a safety concern.  The family member/significant other verbalizes understanding of the suicide prevention education information provided.  The family member/significant other agrees to remove the items of safety concern listed above. Mother doesn't have any concerns with pt discharging today.  Velmer Broadfoot N Horton 10/20/2013, 10:17 AM

## 2013-10-20 NOTE — Tx Team (Signed)
Interdisciplinary Treatment Plan Update (Adult)  Date: 10/20/2013  Time Reviewed:  9:45 AM  Progress in Treatment: Attending groups: Yes Participating in groups:  Yes Taking medication as prescribed:  Yes Tolerating medication:  Yes Family/Significant othe contact made: Yes, with pt's mother Patient understands diagnosis:  Yes Discussing patient identified problems/goals with staff:  Yes Medical problems stabilized or resolved:  Yes Denies suicidal/homicidal ideation: Yes Issues/concerns per patient self-inventory:  Yes Other:  New problem(s) identified: N/A  Discharge Plan or Barriers: Pt will follow up at [facility] in [place] for medication management and therapy.    Reason for Continuation of Hospitalization: Stable to d/c today  Comments: N/A  Estimated length of stay: D/C today  For review of initial/current patient goals, please see plan of care.  Attendees: Patient:  Richard Heath 10/20/2013 10:18 AM   Family:     Physician:  Dr. Javier Glazier 10/20/2013 10:18 AM   Nursing:   Nestor Ramp, RN 10/20/2013 10:18 AM   Clinical Social Worker:  Reyes Ivan, LCSW 10/20/2013 10:18 AM   Other: Verne Spurr, PA 10/20/2013 10:18 AM   Other:  Sherrye Payor, care coordination 10/20/2013 10:18 AM   Other:  Juline Patch, LCSW 10/20/2013 10:18 AM   Other:  Quintella Reichert, RN 10/20/2013 10:22 AM   Other: Onnie Boer, UR case manager 10/20/2013 10:22 AM   Other:    Other:    Other:    Other:      Scribe for Treatment Team:   Carmina Miller, 10/20/2013 , 10:18 AM

## 2013-10-20 NOTE — Progress Notes (Signed)
[facility] Adult Case Management Discharge Plan :  Will you be returning to the same living situation after discharge: Yes,  returning home in [place] with mom as support At discharge, do you have transportation home?:Yes,  mom will pick pt up at d/c Do you have the ability to pay for your medications:Yes,  provided pt with prescriptions and pt verbalizes ability to afford meds  Release of information consent forms completed and in the chart;  Patient's signature needed at discharge.  Patient to Follow up at: Follow-up Information   Follow up with [facility] On 11/05/2013. (Appointment scheduled at 8:45 am on this date with Dr. Tenny Craw for medication management)    Contact information:   [address], [address] [place], [place] [address] Phone: [phone]      Follow up with [facility] On 11/10/2013. (Appointment scheduled at 9:45 am on this date with Florencia Reasons for therapy )    Contact information:   [address], [address] [place], [place] [address] Phone: [phone]      Patient denies SI/HI:   Yes,  denies SI/HI    Safety Planning and Suicide Prevention discussed:  Yes,  discussed with pt and pt's mother.  See suicide prevention education note.   Vernessa Likes N Horton 10/20/2013, 10:18 AM

## 2013-10-22 NOTE — Progress Notes (Signed)
Patient Discharge Instructions:  Next Level Care Provider Has Access to the EMR, 10/22/13 Records provided to [facility] via [facility]/Epic access  Jerelene Redden, 10/22/2013, 2:51 PM

## 2013-10-23 ENCOUNTER — Other Ambulatory Visit (HOSPITAL_COMMUNITY): Payer: Self-pay | Admitting: Psychiatry

## 2013-10-23 ENCOUNTER — Encounter (HOSPITAL_COMMUNITY): Payer: Self-pay | Admitting: Psychiatry

## 2013-10-23 ENCOUNTER — Ambulatory Visit (INDEPENDENT_AMBULATORY_CARE_PROVIDER_SITE_OTHER): Payer: Federal, State, Local not specified - PPO | Admitting: Psychiatry

## 2013-10-23 ENCOUNTER — Telehealth (HOSPITAL_COMMUNITY): Payer: Self-pay | Admitting: *Deleted

## 2013-10-23 VITALS — BP 140/94 | Ht 72.0 in | Wt 210.0 lb

## 2013-10-23 DIAGNOSIS — F3162 Bipolar disorder, current episode mixed, moderate: Secondary | ICD-10-CM

## 2013-10-23 DIAGNOSIS — F411 Generalized anxiety disorder: Secondary | ICD-10-CM

## 2013-10-23 MED ORDER — CLONAZEPAM 1 MG PO TABS: 1.0000 mg | ORAL_TABLET | Freq: Three times a day (TID) | ORAL | Status: AC

## 2013-10-23 MED ORDER — BUSPIRONE HCL 10 MG PO TABS: 10.0000 mg | ORAL_TABLET | Freq: Three times a day (TID) | ORAL | Status: AC

## 2013-10-23 MED ORDER — LAMOTRIGINE 25 MG PO TABS: 50.0000 mg | ORAL_TABLET | Freq: Two times a day (BID) | ORAL | Status: AC

## 2013-10-23 MED ORDER — QUETIAPINE FUMARATE 50 MG PO TABS: 50.0000 mg | ORAL_TABLET | Freq: Every day | ORAL | Status: AC

## 2013-10-23 NOTE — Telephone Encounter (Signed)
Ok to fill 
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
## 2013-10-24 ENCOUNTER — Encounter (HOSPITAL_COMMUNITY): Payer: Self-pay | Admitting: Emergency Medicine

## 2013-10-24 ENCOUNTER — Emergency Department (HOSPITAL_COMMUNITY)
Admission: EM | Admit: 2013-10-24 | Discharge: 2013-10-25 | Discharge status: Against Medical Advice | Payer: Federal, State, Local not specified - PPO | Attending: Emergency Medicine | Admitting: Emergency Medicine

## 2013-10-24 DIAGNOSIS — F4310 Post-traumatic stress disorder, unspecified: Secondary | ICD-10-CM | POA: Insufficient documentation

## 2013-10-24 DIAGNOSIS — R4689 Other symptoms and signs involving appearance and behavior: Secondary | ICD-10-CM

## 2013-10-24 DIAGNOSIS — R4589 Other symptoms and signs involving emotional state: Secondary | ICD-10-CM

## 2013-10-24 DIAGNOSIS — I10 Essential (primary) hypertension: Secondary | ICD-10-CM | POA: Insufficient documentation

## 2013-10-24 DIAGNOSIS — J45909 Unspecified asthma, uncomplicated: Secondary | ICD-10-CM | POA: Insufficient documentation

## 2013-10-24 DIAGNOSIS — Z79899 Other long term (current) drug therapy: Secondary | ICD-10-CM | POA: Insufficient documentation

## 2013-10-24 DIAGNOSIS — F411 Generalized anxiety disorder: Secondary | ICD-10-CM | POA: Insufficient documentation

## 2013-10-24 DIAGNOSIS — F319 Bipolar disorder, unspecified: Secondary | ICD-10-CM | POA: Insufficient documentation

## 2013-10-24 DIAGNOSIS — R45851 Suicidal ideations: Secondary | ICD-10-CM | POA: Insufficient documentation

## 2013-10-24 HISTORY — DX: Post-traumatic stress disorder, unspecified: F43.10

## 2013-10-24 LAB — COMPREHENSIVE METABOLIC PANEL
ALBUMIN: 4.1 g/dL (ref 3.5–5.2)
ALK PHOS: 79 U/L (ref 39–117)
ALT: 17 U/L (ref 0–53)
AST: 24 U/L (ref 0–37)
BUN: 18 mg/dL (ref 6–23)
CALCIUM: 9.7 mg/dL (ref 8.4–10.5)
CO2: 24 mEq/L (ref 19–32)
Chloride: 102 mEq/L (ref 96–112)
Creatinine, Ser: 0.79 mg/dL (ref 0.50–1.35)
GFR calc Af Amer: >90 mL/min (ref >90)
GFR calc non Af Amer: >90 mL/min (ref >90)
Glucose, Bld: 91 mg/dL (ref 70–99)
POTASSIUM: 4.4 meq/L (ref 3.7–5.3)
SODIUM: 140 meq/L (ref 137–147)
Total Bilirubin: 0.2 mg/dL — ABNORMAL LOW (ref 0.3–1.2)
Total Protein: 7.7 g/dL (ref 6.0–8.3)

## 2013-10-24 LAB — CBC WITH DIFFERENTIAL/PLATELET
BASOS PCT: 0 % (ref 0–1)
Basophils Absolute: 0 10*3/uL (ref 0.0–0.1)
EOS ABS: 0.3 10*3/uL (ref 0.0–0.7)
EOS PCT: 4 % (ref 0–5)
HCT: 42.6 % (ref 39.0–52.0)
Hemoglobin: 14.3 g/dL (ref 13.0–17.0)
Lymphocytes Relative: 24 % (ref 12–46)
Lymphs Abs: 2 10*3/uL (ref 0.7–4.0)
MCH: 29.1 pg (ref 26.0–34.0)
MCHC: 33.6 g/dL (ref 30.0–36.0)
MCV: 86.8 fL (ref 78.0–100.0)
Monocytes Absolute: 1.1 10*3/uL — ABNORMAL HIGH (ref 0.1–1.0)
Monocytes Relative: 13 % — ABNORMAL HIGH (ref 3–12)
NEUTROS PCT: 59 % (ref 43–77)
Neutro Abs: 4.8 10*3/uL (ref 1.7–7.7)
PLATELETS: 262 10*3/uL (ref 150–400)
RBC: 4.91 MIL/uL (ref 4.22–5.81)
RDW: 12.8 % (ref 11.5–15.5)
WBC: 8.2 10*3/uL (ref 4.0–10.5)

## 2013-10-24 LAB — RAPID URINE DRUG SCREEN, HOSP PERFORMED
AMPHETAMINES: NOT DETECTED
Barbiturates: NOT DETECTED
Benzodiazepines: POSITIVE — AB
COCAINE: NOT DETECTED
OPIATES: NOT DETECTED
TETRAHYDROCANNABINOL: POSITIVE — AB

## 2013-10-24 LAB — DRUGS OF ABUSE SCREEN W/O ALC, ROUTINE URINE
Amphetamine Screen, Ur: NEGATIVE
BENZODIAZEPINES.: NEGATIVE
Barbiturate Quant, Ur: NEGATIVE
COCAINE METABOLITES: NEGATIVE
Creatinine,U: 149.6 mg/dL
METHADONE: NEGATIVE
Marijuana Metabolite: POSITIVE — AB
Opiate Screen, Urine: NEGATIVE
Phencyclidine (PCP): NEGATIVE
Propoxyphene: NEGATIVE

## 2013-10-24 LAB — ETHANOL

## 2013-10-24 MED ORDER — BUSPIRONE HCL 5 MG PO TABS
10.0000 mg | ORAL_TABLET | Freq: Three times a day (TID) | ORAL | Status: DC
  Filled 2013-10-24 (×5): qty 1

## 2013-10-24 MED ORDER — LAMOTRIGINE 25 MG PO TABS
50.0000 mg | ORAL_TABLET | Freq: Two times a day (BID) | ORAL | Status: DC
  Filled 2013-10-24 (×4): qty 2

## 2013-10-24 MED ORDER — QUETIAPINE FUMARATE 25 MG PO TABS
50.0000 mg | ORAL_TABLET | Freq: Every day | ORAL | Status: DC
  Filled 2013-10-24 (×2): qty 1

## 2013-10-24 MED ORDER — CLONAZEPAM 0.5 MG PO TABS
1.0000 mg | ORAL_TABLET | Freq: Three times a day (TID) | ORAL | Status: DC
  Filled 2013-10-24: qty 2

## 2013-10-24 NOTE — ED Notes (Addendum)
Patient states "I don't want to live anymore.  I'm tired of fooling with this stupida** bipolar disorder."  Recently hospitalized for same.

## 2013-10-24 NOTE — ED Provider Notes (Signed)
CSN: [id number]     Arrival date & time 10/24/13  2053 History  This chart was scribed for Benny LennertJoseph L Derrell Milanes, MD by Danella Maiersaroline Early, ED Scribe. This patient was seen in room APAH8/APAH8 and the patient's care was started at 9:47 PM.    Chief Complaint  Patient presents with  . V70.1   Patient is a 23 y.o. male presenting with mental health disorder. The history is provided by the patient. No language interpreter was used.  Mental Health Problem Presenting symptoms: suicidal thoughts   Presenting symptoms: no hallucinations   Timing:  Constant Progression:  Unchanged Chronicity:  Recurrent Relieved by:  Nothing Associated symptoms: no abdominal pain, no appetite change, no chest pain, no fatigue and no headaches    HPI Comments: Richard Heath is a 23 y.o. male with a h/o anxiety, panic, OCD, PTSD, and bipolar 1 disorder who presents to the Emergency Department complaining of suicidal ideation. He states his medications are not working and he just does not want to deal with his bipolar anymore. He was here one week ago for the same and kept in the hospital for 2 days. He states he was diagnosed with HTN but not on medication (BP at triage 139/88).   Past Medical History  Diagnosis Date  . Asthma   . Anxiety   . Panic attack   . Hypertension   . GAD (generalized anxiety disorder)   . OCD (obsessive compulsive disorder)   . Bipolar 1 disorder   . PTSD (post-traumatic stress disorder)    Past Surgical History  Procedure Laterality Date  . No past surgeries     Family History  Problem Relation Age of Onset  . Anxiety disorder Father   . Alcohol abuse Father   . Anxiety disorder Maternal Aunt   . Anxiety disorder Cousin    History  Substance Use Topics  . Smoking status: Never Smoker   . Smokeless tobacco: Not on file  . Alcohol Use: No    Review of Systems  Constitutional: Negative for appetite change and fatigue.  HENT: Negative for congestion, ear discharge and sinus  pressure.   Eyes: Negative for discharge.  Respiratory: Negative for cough.   Cardiovascular: Negative for chest pain.  Gastrointestinal: Negative for abdominal pain and diarrhea.  Genitourinary: Negative for frequency and hematuria.  Musculoskeletal: Negative for back pain.  Skin: Negative for rash.  Neurological: Negative for seizures and headaches.  Psychiatric/Behavioral: Positive for suicidal ideas. Negative for hallucinations.      Allergies  Review of patient's allergies indicates no known allergies.  Home Medications   Prior to Admission medications   Medication Sig Start Date End Date Taking? Authorizing Provider  busPIRone (BUSPAR) 10 MG tablet Take 1 tablet (10 mg total) by mouth 3 (three) times daily. 10/23/13   Diannia Rudereborah Ross, MD  clonazePAM (KLONOPIN) 1 MG tablet Take 1 tablet (1 mg total) by mouth 3 (three) times daily. 10/23/13 10/23/14  Diannia Rudereborah Ross, MD  lamoTRIgine (LAMICTAL) 25 MG tablet Take 2 tablets (50 mg total) by mouth 2 (two) times daily. 10/23/13   Diannia Rudereborah Ross, MD  QUEtiapine (SEROQUEL) 50 MG tablet Take 1 tablet (50 mg total) by mouth at bedtime. 10/23/13   Diannia Rudereborah Ross, MD   BP 139/88  Pulse 81  Temp(Src) 97.5 F (36.4 C) (Oral)  Resp 24  Ht 6' (1.829 m)  Wt 218 lb (98.884 kg)  BMI 29.56 kg/m2  SpO2 100% Physical Exam  Constitutional: He is oriented to person, place, and  time. He appears well-developed.  HENT:  Head: Normocephalic.  Eyes: Conjunctivae and EOM are normal. No scleral icterus.  Neck: Neck supple. No thyromegaly present.  Cardiovascular: Normal rate and regular rhythm.  Exam reveals no gallop and no friction rub.   No murmur heard. Pulmonary/Chest: No stridor. He has no wheezes. He has no rales. He exhibits no tenderness.  Abdominal: He exhibits no distension. There is no tenderness. There is no rebound.  Musculoskeletal: Normal range of motion. He exhibits no edema.  Lymphadenopathy:    He has no cervical adenopathy.  Neurological: He is  oriented to person, place, and time. He exhibits normal muscle tone. Coordination normal.  Skin: No rash noted. No erythema.  Psychiatric: His behavior is normal. He exhibits a depressed mood. He expresses suicidal ideation.    ED Course  Procedures (including critical care time) Medications - No data to display  DIAGNOSTIC STUDIES: Oxygen Saturation is 100% on RA, normal by my interpretation.    COORDINATION OF CARE: 9:53 PM- Discussed treatment plan with pt. Pt agrees to plan.    Labs Review Labs Reviewed  URINE RAPID DRUG SCREEN (HOSP PERFORMED) - Abnormal; Notable for the following:    Benzodiazepines POSITIVE (*)    Tetrahydrocannabinol POSITIVE (*)    All other components within normal limits  CBC WITH DIFFERENTIAL - Abnormal; Notable for the following:    Monocytes Relative 13 (*)    Monocytes Absolute 1.1 (*)    All other components within normal limits  COMPREHENSIVE METABOLIC PANEL - Abnormal; Notable for the following:    Total Bilirubin 0.2 (*)    All other components within normal limits  ETHANOL    Imaging Review No results found.   EKG Interpretation None      MDM   Final diagnoses:  None    The chart was scribed for me under my direct supervision.  I personally performed the history, physical, and medical decision making and all procedures in the evaluation of this patient.Benny Lennert, MD 10/24/13 [id number]

## 2013-10-25 MED ORDER — LORAZEPAM 1 MG PO TABS
1.0000 mg | ORAL_TABLET | Freq: Three times a day (TID) | ORAL | Status: DC | PRN
  Administered 2013-10-25: 1 mg via ORAL
  Filled 2013-10-25: qty 1

## 2013-10-25 MED ORDER — ONDANSETRON HCL 4 MG PO TABS: 4.0000 mg | ORAL_TABLET | Freq: Three times a day (TID) | ORAL | Status: DC | PRN

## 2013-10-25 MED ORDER — IBUPROFEN 400 MG PO TABS: 600.0000 mg | ORAL_TABLET | Freq: Three times a day (TID) | ORAL | Status: DC | PRN

## 2013-10-25 MED ORDER — ACETAMINOPHEN 325 MG PO TABS: 650.0000 mg | ORAL_TABLET | ORAL | Status: DC | PRN

## 2013-10-25 MED ORDER — ZOLPIDEM TARTRATE 5 MG PO TABS: 5.0000 mg | ORAL_TABLET | Freq: Every evening | ORAL | Status: DC | PRN

## 2013-10-25 MED ORDER — ALUM & MAG HYDROXIDE-SIMETH 200-200-20 MG/5ML PO SUSP: 30.0000 mL | ORAL | Status: DC | PRN

## 2013-10-25 MED ORDER — OLANZAPINE 5 MG PO TBDP
10.0000 mg | ORAL_TABLET | Freq: Once | ORAL | Status: AC
  Administered 2013-10-25: 10 mg via ORAL
  Filled 2013-10-25: qty 2

## 2013-10-25 NOTE — ED Notes (Signed)
Advised patient that he is not allowed to leave. Advised that he is under involuntary commitment. Emergency planning/management officer at bedside. Patient verbalized understanding and agreed to cooperate with staff.

## 2013-10-25 NOTE — ED Notes (Signed)
Patient refuses to take ordered medications. States "those medicines don't work, that's why I'm here." Advised MD.

## 2013-10-25 NOTE — ED Notes (Signed)
Patient states "I want to leave." Advised Dr Preston Fleeting.

## 2013-10-25 NOTE — ED Provider Notes (Signed)
Patient threatened to sign out because he states that he was being given the same medication she had been given before and they weren't working. I have talked with him and he is being prescribed lorazepam to help with anxiety and consultation will be obtained with a TTS to help with medications and help arrange appropriate disposition.  Dione Booze, MD 10/25/13 [phone]

## 2013-10-25 NOTE — ED Notes (Signed)
Patient complaining of back pain. Patient refuses tylenol or ibuprofen. States "I don't want those, I could have stayed home and took those."

## 2013-10-25 NOTE — ED Provider Notes (Signed)
Pt accepted to [facility], Dr. Maureen Chatters. Will transfer stable.   Richard Anger, DO 10/25/13 [phone]
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
## 2013-10-25 NOTE — BH Assessment (Signed)
Received a call for a tele-assessment. Spoke with Dr. Preston Fleeting who reported that patient is currently having suicidal thoughts. Pt was recently hospitalized. Pt is anxious, shaky and jittery. Pt is threatening to leave. Tele-assessment will be initiated.

## 2013-10-25 NOTE — BH Assessment (Signed)
Tele-assessment. Consulted with Alberteen Sam, NP who recommended inpatient treatment. Dr. Preston Fleeting has been notified of recommendations.

## 2013-10-25 NOTE — Progress Notes (Addendum)
The Following Facilities were contacted:  [facility]: No Beds Available [facility]: No Beds [facility]: No Beds Available [facility]: No Beds Available [facility]: No Beds [facility]: Referral Faxed @[phone]  received call No Beds Available [facility]: Referral Faxed [facility]: Referral Faxed, Declined due to UDS positive for [facility]: Referral Faxed  [facility] Disposition MHT

## 2013-10-30 LAB — CANNABANOIDS (GC/LC/MS), URINE: THC-COOH UR CONFIRM: 62 ng/mL — AB (ref <5)

## 2013-11-03 ENCOUNTER — Ambulatory Visit (HOSPITAL_COMMUNITY): Payer: Self-pay | Admitting: Psychiatry

## 2013-11-05 ENCOUNTER — Ambulatory Visit (HOSPITAL_COMMUNITY): Payer: Self-pay | Admitting: Psychiatry

## 2013-11-10 ENCOUNTER — Ambulatory Visit (HOSPITAL_COMMUNITY): Payer: Self-pay | Admitting: Psychiatry

## 2013-11-11 ENCOUNTER — Encounter (HOSPITAL_COMMUNITY): Payer: Self-pay | Admitting: Psychiatry

## 2013-11-20 ENCOUNTER — Ambulatory Visit (HOSPITAL_COMMUNITY): Payer: Self-pay | Admitting: Psychiatry

## 2013-11-24 ENCOUNTER — Emergency Department (HOSPITAL_COMMUNITY): Payer: Federal, State, Local not specified - PPO

## 2013-11-24 ENCOUNTER — Emergency Department (HOSPITAL_COMMUNITY)
Admission: EM | Admit: 2013-11-24 | Discharge: 2013-11-24 | Disposition: A | Discharge status: Home / Self Care | Payer: Federal, State, Local not specified - PPO | Attending: Emergency Medicine | Admitting: Emergency Medicine

## 2013-11-24 ENCOUNTER — Encounter (HOSPITAL_COMMUNITY): Payer: Self-pay | Admitting: Emergency Medicine

## 2013-11-24 DIAGNOSIS — IMO0002 Reserved for concepts with insufficient information to code with codable children: Secondary | ICD-10-CM | POA: Insufficient documentation

## 2013-11-24 DIAGNOSIS — Z79899 Other long term (current) drug therapy: Secondary | ICD-10-CM | POA: Insufficient documentation

## 2013-11-24 DIAGNOSIS — I10 Essential (primary) hypertension: Secondary | ICD-10-CM | POA: Insufficient documentation

## 2013-11-24 DIAGNOSIS — S60512A Abrasion of left hand, initial encounter: Secondary | ICD-10-CM

## 2013-11-24 DIAGNOSIS — F319 Bipolar disorder, unspecified: Secondary | ICD-10-CM | POA: Insufficient documentation

## 2013-11-24 DIAGNOSIS — J45909 Unspecified asthma, uncomplicated: Secondary | ICD-10-CM | POA: Insufficient documentation

## 2013-11-24 DIAGNOSIS — F4310 Post-traumatic stress disorder, unspecified: Secondary | ICD-10-CM | POA: Insufficient documentation

## 2013-11-24 MED ORDER — CIPROFLOXACIN HCL 500 MG PO TABS: 500.0000 mg | ORAL_TABLET | Freq: Two times a day (BID) | ORAL | Status: AC

## 2013-11-24 MED ORDER — IBUPROFEN 800 MG PO TABS
800.0000 mg | ORAL_TABLET | Freq: Once | ORAL | Status: AC
  Administered 2013-11-24: 800 mg via ORAL
  Filled 2013-11-24: qty 1

## 2013-11-24 MED ORDER — DOXYCYCLINE HYCLATE 100 MG PO CAPS: 100.0000 mg | ORAL_CAPSULE | Freq: Two times a day (BID) | ORAL | Status: AC

## 2013-11-24 MED ORDER — CIPROFLOXACIN HCL 250 MG PO TABS
500.0000 mg | ORAL_TABLET | Freq: Once | ORAL | Status: AC
  Administered 2013-11-24: 500 mg via ORAL
  Filled 2013-11-24: qty 2

## 2013-11-24 MED ORDER — ONDANSETRON HCL 4 MG PO TABS
4.0000 mg | ORAL_TABLET | Freq: Once | ORAL | Status: AC
  Administered 2013-11-24: 4 mg via ORAL
  Filled 2013-11-24: qty 1

## 2013-11-24 MED ORDER — DICLOFENAC SODIUM 75 MG PO TBEC: 75.0000 mg | DELAYED_RELEASE_TABLET | Freq: Two times a day (BID) | ORAL | Status: AC

## 2013-11-24 MED ORDER — HYDROCODONE-ACETAMINOPHEN 5-325 MG PO TABS
1.0000 | ORAL_TABLET | Freq: Once | ORAL | Status: AC
  Administered 2013-11-24: 1 via ORAL
  Filled 2013-11-24: qty 1

## 2013-11-24 MED ORDER — DOXYCYCLINE HYCLATE 100 MG PO TABS
100.0000 mg | ORAL_TABLET | Freq: Once | ORAL | Status: AC
  Administered 2013-11-24: 100 mg via ORAL
  Filled 2013-11-24: qty 1

## 2013-11-24 MED ORDER — BACITRACIN-NEOMYCIN-POLYMYXIN 400-5-5000 EX OINT
TOPICAL_OINTMENT | Freq: Once | CUTANEOUS | Status: AC
  Administered 2013-11-24: 1 via TOPICAL
  Filled 2013-11-24: qty 1
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
## 2013-11-24 NOTE — ED Provider Notes (Signed)
Medical screening examination/treatment/procedure(s) were performed by non-physician practitioner and as supervising physician I was immediately available for consultation/collaboration.   EKG Interpretation None        Lenardo Westwood L Lenzie Sandler, MD 11/24/13 2352 

## 2013-11-24 NOTE — ED Notes (Signed)
Patient seen and assessed by ED PA 
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
## 2013-11-26 ENCOUNTER — Encounter (HOSPITAL_COMMUNITY): Payer: Self-pay | Admitting: Psychiatry

## 2013-11-26 ENCOUNTER — Ambulatory Visit (HOSPITAL_COMMUNITY): Payer: Self-pay | Admitting: Psychiatry

## 2013-12-03 ENCOUNTER — Telehealth (HOSPITAL_COMMUNITY): Payer: Self-pay | Admitting: *Deleted

## 2013-12-20 DEATH — deceased
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
